# Patient Record
Sex: Female | Born: 1975 | Race: White | Hispanic: No | Marital: Single | Smoking: Never smoker
Health system: Southern US, Community
[De-identification: ages and names within clinical notes are randomized; demographics above are authoritative.]

## PROBLEM LIST (undated history)

## (undated) DIAGNOSIS — E119 Type 2 diabetes mellitus without complications: Secondary | ICD-10-CM

## (undated) DIAGNOSIS — E7800 Pure hypercholesterolemia, unspecified: Secondary | ICD-10-CM

## (undated) DIAGNOSIS — G40909 Epilepsy, unspecified, not intractable, without status epilepticus: Secondary | ICD-10-CM

## (undated) DIAGNOSIS — R569 Unspecified convulsions: Secondary | ICD-10-CM

## (undated) HISTORY — DX: Type 2 diabetes mellitus without complications: E11.9

## (undated) HISTORY — DX: Pure hypercholesterolemia, unspecified: E78.00

## (undated) HISTORY — DX: Unspecified convulsions: R56.9

## (undated) HISTORY — DX: Epilepsy, unspecified, not intractable, without status epilepticus: G40.909

---

## 2007-08-01 ENCOUNTER — Emergency Department (HOSPITAL_COMMUNITY): Admission: EM | Admit: 2007-08-01 | Discharge: 2007-08-01 | Payer: Self-pay | Admitting: Family Medicine

## 2007-12-10 ENCOUNTER — Emergency Department (HOSPITAL_COMMUNITY): Admission: EM | Admit: 2007-12-10 | Discharge: 2007-12-11 | Payer: Self-pay | Admitting: Emergency Medicine

## 2011-06-17 LAB — COMPREHENSIVE METABOLIC PANEL
ALT: 29
AST: 30
Albumin: 3.9
Calcium: 9.1
Creatinine, Ser: 0.68
GFR calc Af Amer: >60
Sodium: 138
Total Protein: 7.7

## 2011-06-17 LAB — URINALYSIS, ROUTINE W REFLEX MICROSCOPIC
Bilirubin Urine: NEGATIVE
Glucose, UA: NEGATIVE
Ketones, ur: NEGATIVE
Nitrite: NEGATIVE
Specific Gravity, Urine: 1.021
pH: 6

## 2011-06-17 LAB — URINE MICROSCOPIC-ADD ON

## 2011-06-17 LAB — LIPASE, BLOOD: Lipase: 24

## 2011-06-17 LAB — DIFFERENTIAL
Eosinophils Absolute: 0
Eosinophils Relative: 0
Lymphocytes Relative: 8 — ABNORMAL LOW
Lymphs Abs: 0.9
Monocytes Absolute: 0.2
Monocytes Relative: 2 — ABNORMAL LOW

## 2011-06-17 LAB — CBC
MCHC: 33.9
MCV: 85.3
Platelets: 308
RBC: 5.14 — ABNORMAL HIGH

## 2011-07-02 LAB — POCT RAPID STREP A: Streptococcus, Group A Screen (Direct): NEGATIVE

## 2014-09-06 DIAGNOSIS — D229 Melanocytic nevi, unspecified: Secondary | ICD-10-CM

## 2014-09-06 HISTORY — DX: Melanocytic nevi, unspecified: D22.9

## 2015-05-03 LAB — HIV ANTIBODY (ROUTINE TESTING W REFLEX): HIV: NONREACTIVE

## 2016-03-22 LAB — TSH: TSH: 1.11 (ref 0.41–5.90)

## 2016-03-22 LAB — LIPID PANEL
Cholesterol: 207 — AB (ref 0–200)
HDL: 82 — AB (ref 35–70)
LDL Cholesterol: 87
Triglycerides: 192 — AB (ref 40–160)

## 2016-03-22 LAB — BASIC METABOLIC PANEL
BUN: 12 (ref 4–21)
CREATININE: 1 (ref 0.5–1.1)
GLUCOSE: 116
POTASSIUM: 4.4 (ref 3.4–5.3)
SODIUM: 139 (ref 137–147)

## 2016-03-22 LAB — CBC AND DIFFERENTIAL
HEMATOCRIT: 45 (ref 36–46)
HEMOGLOBIN: 15.4 (ref 12.0–16.0)
Platelets: 307 (ref 150–399)
WBC: 8.2

## 2016-03-22 LAB — HEMOGLOBIN A1C: HEMOGLOBIN A1C: 6.1

## 2016-03-22 LAB — VITAMIN D 25 HYDROXY (VIT D DEFICIENCY, FRACTURES)

## 2016-03-22 LAB — VITAMIN B12: Vitamin B-12: 326

## 2016-03-23 HISTORY — PX: OTHER SURGICAL HISTORY: SHX169

## 2016-04-02 ENCOUNTER — Other Ambulatory Visit: Payer: Self-pay | Admitting: Family Medicine

## 2016-04-02 DIAGNOSIS — Z1231 Encounter for screening mammogram for malignant neoplasm of breast: Secondary | ICD-10-CM

## 2016-04-29 ENCOUNTER — Ambulatory Visit: Payer: Self-pay

## 2016-05-08 ENCOUNTER — Ambulatory Visit: Payer: Self-pay

## 2016-05-13 ENCOUNTER — Ambulatory Visit
Admission: RE | Admit: 2016-05-13 | Discharge: 2016-05-13 | Disposition: A | Discharge status: Home / Self Care | Payer: Medicare Other | Source: Ambulatory Visit | Attending: Family Medicine | Admitting: Family Medicine

## 2016-05-13 DIAGNOSIS — Z1231 Encounter for screening mammogram for malignant neoplasm of breast: Secondary | ICD-10-CM

## 2016-05-14 ENCOUNTER — Other Ambulatory Visit: Payer: Self-pay | Admitting: Family Medicine

## 2016-05-14 DIAGNOSIS — R928 Other abnormal and inconclusive findings on diagnostic imaging of breast: Secondary | ICD-10-CM

## 2016-05-20 ENCOUNTER — Ambulatory Visit
Admission: RE | Admit: 2016-05-20 | Discharge: 2016-05-20 | Disposition: A | Discharge status: Home / Self Care | Payer: Medicare Other | Source: Ambulatory Visit | Attending: Family Medicine | Admitting: Family Medicine

## 2016-05-20 DIAGNOSIS — R928 Other abnormal and inconclusive findings on diagnostic imaging of breast: Secondary | ICD-10-CM

## 2016-10-31 ENCOUNTER — Ambulatory Visit (INDEPENDENT_AMBULATORY_CARE_PROVIDER_SITE_OTHER): Payer: Medicare Other | Admitting: Internal Medicine

## 2016-10-31 ENCOUNTER — Encounter: Payer: Self-pay | Admitting: Internal Medicine

## 2016-10-31 ENCOUNTER — Telehealth: Payer: Self-pay | Admitting: Internal Medicine

## 2016-10-31 VITALS — BP 104/60 | HR 79 | Temp 99.1°F | Ht 62.5 in | Wt 176.0 lb

## 2016-10-31 DIAGNOSIS — Z87898 Personal history of other specified conditions: Secondary | ICD-10-CM | POA: Diagnosis not present

## 2016-10-31 DIAGNOSIS — Z7689 Persons encountering health services in other specified circumstances: Secondary | ICD-10-CM | POA: Diagnosis present

## 2016-10-31 LAB — POCT GLYCOSYLATED HEMOGLOBIN (HGB A1C): Hemoglobin A1C: 5.8

## 2016-10-31 NOTE — Telephone Encounter (Signed)
Called patient's caregiver Ms. Francee Piccolo. Discussed A1c. Reports her last a1c is 6.4. Recommended continued diet and lifestyle management.

## 2016-10-31 NOTE — Progress Notes (Signed)
   [facility] Phone: [phone]   Date of Visit: 10/31/2016   HPI:  Patient presents today to establish care. No specific concerns today. Patient comes with her caregiver.   Last PCP was Dr. Tamala Julian who diagnosed her with "boderline diabetes". She was given Metformin for this, but she never started this. Instead they made diet and lifestyle changes. She checks her cbgs daily and all have been < 120 for fasting. Reports last A1c was 6.4. She also had a bone scan which her caregiver reports she thinks is normal; patient was started on Magnesium oxide, MVM, Vit D3, and calcium after the bone scan. They are unsure why the bone scan was initially ordered for the patient.    PMH: updated in chart Family History: updated in chart  Social history: [facility] in [facility]. Exercises twice a week. Never smoker. No other drug use. No alcohol use. Updated in chart Surgical history: updated in chart  Health Maintenance:  Pap smear: had a pap smear over 10 years ago which was traumatic for patient. Asked if continued surveillance is necessary. Discussed this. Caregiver to discuss with patient's mother regarding this.  Mammogram: 2017- normal per caregiver DEXA 2017 normal.  Flu vaccine 05/2016   Periods: none; skips the placebo pills of sprintec  Contraception: sprintec  Pelvic symptoms: denies  Sexual activity: no  Mood: PHq 2 negative   ROS: See HPI   PHYSICAL EXAM: BP 104/60   Pulse 79   Temp 99.1 F (37.3 C) (Oral)   Ht 5' 2.5" (1.588 m)   Wt 176 lb (79.8 kg)   SpO2 98%   BMI 31.68 kg/m  Gen: NAD, pleasant, cooperative HEENT: NCAT, PERRL, no palpable thyromegaly or anterior cervical lymphadenopathy Heart: RRR, no murmurs Lungs: CTAB, NWOB Abdomen: soft, nontender to palpation Neuro: grossly nonfocal, speech normal  ASSESSMENT/PLAN:  # Health maintenance:  - signed release of information for prior PCP and neurologist.  - caregiver to call back  regarding pap smear decision with mother  Prediabetes Caregiver reports last A1c was 6.4. A1c 5.8. Continue lifestyle and diet control.    Smiley Houseman, MD PGY [place]

## 2016-10-31 NOTE — Patient Instructions (Signed)
It was nice to meet you  We will check your Hemoglobin A1c today. I will call you with the results.   We will get records from Dr. Tamala Julian and Dr. Marlou Sa. I will give you a call when I get these records.

## 2016-11-01 ENCOUNTER — Encounter: Payer: Self-pay | Admitting: Internal Medicine

## 2016-11-01 DIAGNOSIS — G40909 Epilepsy, unspecified, not intractable, without status epilepticus: Secondary | ICD-10-CM | POA: Insufficient documentation

## 2016-11-01 DIAGNOSIS — E119 Type 2 diabetes mellitus without complications: Secondary | ICD-10-CM | POA: Insufficient documentation

## 2016-11-01 DIAGNOSIS — R7303 Prediabetes: Secondary | ICD-10-CM | POA: Insufficient documentation

## 2016-11-01 DIAGNOSIS — E785 Hyperlipidemia, unspecified: Secondary | ICD-10-CM | POA: Insufficient documentation

## 2016-11-05 ENCOUNTER — Encounter: Payer: Self-pay | Admitting: Internal Medicine

## 2016-11-05 DIAGNOSIS — Z3041 Encounter for surveillance of contraceptive pills: Secondary | ICD-10-CM | POA: Insufficient documentation

## 2016-11-05 NOTE — Assessment & Plan Note (Signed)
Caregiver reports last A1c was 6.4. A1c 5.8. Continue lifestyle and diet control.

## 2016-11-14 ENCOUNTER — Other Ambulatory Visit: Payer: Self-pay | Admitting: Internal Medicine

## 2017-02-03 ENCOUNTER — Other Ambulatory Visit: Payer: Self-pay | Admitting: Internal Medicine

## 2017-02-10 ENCOUNTER — Encounter: Payer: Self-pay | Admitting: Internal Medicine

## 2017-02-10 ENCOUNTER — Ambulatory Visit (INDEPENDENT_AMBULATORY_CARE_PROVIDER_SITE_OTHER): Payer: Medicare Other | Admitting: Internal Medicine

## 2017-02-10 VITALS — BP 108/68 | HR 74 | Temp 98.3°F | Ht 62.5 in | Wt 169.0 lb

## 2017-02-10 DIAGNOSIS — R1900 Intra-abdominal and pelvic swelling, mass and lump, unspecified site: Secondary | ICD-10-CM

## 2017-02-10 DIAGNOSIS — Z8639 Personal history of other endocrine, nutritional and metabolic disease: Secondary | ICD-10-CM | POA: Insufficient documentation

## 2017-02-10 DIAGNOSIS — E785 Hyperlipidemia, unspecified: Secondary | ICD-10-CM | POA: Diagnosis present

## 2017-02-10 LAB — POCT URINE PREGNANCY: PREG TEST UR: NEGATIVE

## 2017-02-10 NOTE — Patient Instructions (Addendum)
I will call you with the results.  Follow up around august

## 2017-02-10 NOTE — Progress Notes (Signed)
   [facility] Phone: [phone]   Date of Visit: 02/10/2017   HPI:  Patient is here for follow up for HLD.   HLD: - has a history of HLD as well as family history - is on Simvastatin 40mg  daily - no RUQ pain or myalgias  Questionable Pelvic Mass: - on physical exam, noted a firm area in the suprapubic region that was nontender to palpation - patient has never noticed it and caretaker has not noticed either. Patient's mother reported to the caretaker (over phone) that patient has been complaining of abdominal pain - denies dysuria or urinary frequency or the need to urinate currently - she is on OCPs and skips placebo tablets. No abnormal vaginal bleeding - caretaker reports patient is not and has never been sexually active.  - patient has never had a pap smear as she is unable to tolerate it.   ROS: See HPI.  [place]:  PMH:  Epilepsy Prediabetes HLD   PHYSICAL EXAM: BP 108/68   Pulse 74   Temp 98.3 F (36.8 C) (Oral)   Ht 5' 2.5" (1.588 m)   Wt 169 lb (76.7 kg)   SpO2 98%   BMI 30.42 kg/m  GEN: NAD CV: RRR, no murmurs, rubs, or gallops PULM: CTAB, normal effort ABD: Soft, nontender, palpable firm area/mass in the suprapubic region which is nontender, normal bowel sounds  SKIN: No rash or cyanosis; warm and well-perfused EXTR: No lower extremity edema or calf tenderness PSYCH: Mood and affect euthymic, normal rate and volume of speech NEURO: Awake, alert, no focal deficits grossly, normal speech   ASSESSMENT/PLAN: Hyperlipidemia, unspecified hyperlipidemia type - Continue Simvastatin - Lipid panel - Hepatic Function Panel  Pelvic mass in female: unclear in etiology. No tenderness, sigs/symptoms of infection. Patient does not tolerate pap smears or vaginal exams, therefore will first obtain trans-abdominal pelvic ultrasound. If abnormal will need transvaginal US. - POCT urine pregnancy is negative.  - US Pelvis Complete;  Future  Smiley Houseman, MD PGY [place]

## 2017-02-10 NOTE — Progress Notes (Signed)
ROI signed and faxed and placed to be scanned for Dr. Marlou Sa  (neurologist @ [facility]) Cindy [name], Cindy [name], Cindy [name]

## 2017-02-11 ENCOUNTER — Encounter: Payer: Self-pay | Admitting: Internal Medicine

## 2017-02-11 LAB — HEPATIC FUNCTION PANEL
ALBUMIN: 3.9 g/dL (ref 3.5–5.5)
ALK PHOS: 48 IU/L (ref 39–117)
ALT: 9 IU/L (ref 0–32)
AST: 12 IU/L (ref 0–40)
BILIRUBIN, DIRECT: 0.1 mg/dL (ref 0.00–0.40)
Bilirubin Total: 0.3 mg/dL (ref 0.0–1.2)
TOTAL PROTEIN: 6.8 g/dL (ref 6.0–8.5)

## 2017-02-11 LAB — LIPID PANEL
Chol/HDL Ratio: 2.9 ratio (ref 0.0–4.4)
Cholesterol, Total: 166 mg/dL (ref 100–199)
HDL: 58 mg/dL (ref >39)
LDL CALC: 75 mg/dL (ref 0–99)
Triglycerides: 163 mg/dL — ABNORMAL HIGH (ref 0–149)
VLDL CHOLESTEROL CAL: 33 mg/dL (ref 5–40)

## 2017-02-11 NOTE — Assessment & Plan Note (Signed)
Lipid panel today. Continue Simvastatin. °

## 2017-02-11 NOTE — Progress Notes (Signed)
Letter sent regarding lipid panel results.

## 2017-02-20 ENCOUNTER — Ambulatory Visit (HOSPITAL_COMMUNITY)
Admission: RE | Admit: 2017-02-20 | Discharge: 2017-02-20 | Disposition: A | Discharge status: Home / Self Care | Payer: Medicare Other | Source: Ambulatory Visit | Attending: Family Medicine | Admitting: Family Medicine

## 2017-02-20 DIAGNOSIS — R1900 Intra-abdominal and pelvic swelling, mass and lump, unspecified site: Secondary | ICD-10-CM

## 2017-02-25 ENCOUNTER — Ambulatory Visit (HOSPITAL_COMMUNITY)
Admission: RE | Admit: 2017-02-25 | Discharge: 2017-02-25 | Disposition: A | Discharge status: Home / Self Care | Payer: Medicare Other | Source: Ambulatory Visit | Attending: Family Medicine | Admitting: Family Medicine

## 2017-02-25 DIAGNOSIS — N858 Other specified noninflammatory disorders of uterus: Secondary | ICD-10-CM | POA: Diagnosis not present

## 2017-02-25 DIAGNOSIS — R1900 Intra-abdominal and pelvic swelling, mass and lump, unspecified site: Secondary | ICD-10-CM | POA: Diagnosis present

## 2017-02-25 DIAGNOSIS — N852 Hypertrophy of uterus: Secondary | ICD-10-CM | POA: Insufficient documentation

## 2017-02-26 ENCOUNTER — Other Ambulatory Visit: Payer: Self-pay | Admitting: Student

## 2017-02-26 DIAGNOSIS — D259 Leiomyoma of uterus, unspecified: Secondary | ICD-10-CM

## 2017-02-26 NOTE — Progress Notes (Signed)
Called patient on her cell phone number. Some one by the name Bobbye Charleston picked up the phone and identified herself as her provider. Per Alma Downs, patient is disabled. I discussed the [place] finding with [place]. I ordered a referral to gynecologist for further evaluation of the fibroid.

## 2017-02-27 ENCOUNTER — Other Ambulatory Visit: Payer: Self-pay | Admitting: Internal Medicine

## 2017-03-04 ENCOUNTER — Encounter: Payer: Self-pay | Admitting: Obstetrics & Gynecology

## 2017-03-14 ENCOUNTER — Encounter: Payer: Self-pay | Admitting: Internal Medicine

## 2017-03-14 ENCOUNTER — Ambulatory Visit (INDEPENDENT_AMBULATORY_CARE_PROVIDER_SITE_OTHER): Payer: Medicare Other | Admitting: Internal Medicine

## 2017-03-14 DIAGNOSIS — N858 Other specified noninflammatory disorders of uterus: Secondary | ICD-10-CM | POA: Insufficient documentation

## 2017-03-14 DIAGNOSIS — F79 Unspecified intellectual disabilities: Secondary | ICD-10-CM | POA: Diagnosis not present

## 2017-03-14 DIAGNOSIS — N859 Noninflammatory disorder of uterus, unspecified: Secondary | ICD-10-CM | POA: Diagnosis not present

## 2017-03-14 NOTE — Assessment & Plan Note (Signed)
FL2 form reviewed and signed today

## 2017-03-14 NOTE — Progress Notes (Signed)
   [facility] Phone: [phone]   Date of Visit: 03/14/2017   HPI:  Patient is here with caregiver to review FL2 Form.  Need for FL2 Form: Intellectual Disability, Epilepsy   - caregiver reports patient gets disoriented easily  - she lives in a ladies group home and is doing well there  - she is able to feed, dress, and bathe herself.  - she is continent  - she is able to verbally communicate - she does have a history of epilepsy which is currently controlled on her current medications. She is followed by a neurologist   Fundal Mass:   - first noted in clinic on 5/21. Pelvic US obtained showed Uterine enlargement with large 12.3 cm hypoechoic fundal mass most likely representing a fibroid - still denies abnormal vaginal bleeding. Is on sprintec and skips placebos  - no dysuria, urinary frequency - caregiver reports of continued intermittent report of low abdominal pain - has regular soft bowel movements  - caregiver reports that patient's mother asked if we would be able to provide a medication for anxiety for this GYN visit in July (7/17). She asked if we could consult with her neurologist to figure out the best medication. Caregiver reports that Ativan has not helped in the past.   ROS: See HPI.  [place]:  Intellectual Disability Epilepsy Prediabetes   PHYSICAL EXAM: BP 102/80   Pulse 82   Temp 98.6 F (37 C) (Oral)   Ht 5' 2.5" (1.588 m)   Wt 169 lb (76.7 kg)   LMP  (LMP Unknown)   SpO2 98%   BMI 30.42 kg/m  GEN: NAD HEENT: normal thyroid  CV: RRR, no murmurs, rubs, or gallops PULM: CTAB, normal effort SKIN: No rash or cyanosis; warm and well-perfused EXTR: No lower extremity edema or calf tenderness PSYCH: Mood and affect euthymic, normal rate and volume of speech NEURO: Awake, alert, no focal deficits grossly, normal speech   ASSESSMENT/PLAN:  Intellectual disability FL2 form reviewed and signed today   Uterine mass Will call  patient's neurologist to figure out which acute anti-anxiety med to use on the day of GYN visit.    Smiley Houseman, MD PGY [place]

## 2017-03-14 NOTE — Assessment & Plan Note (Signed)
Will call patient's neurologist to figure out which acute anti-anxiety med to use on the day of GYN visit.

## 2017-03-24 ENCOUNTER — Ambulatory Visit: Payer: Medicare Other | Admitting: Internal Medicine

## 2017-03-28 ENCOUNTER — Encounter: Payer: Self-pay | Admitting: Internal Medicine

## 2017-03-28 ENCOUNTER — Telehealth: Payer: Self-pay | Admitting: Internal Medicine

## 2017-03-28 DIAGNOSIS — M8580 Other specified disorders of bone density and structure, unspecified site: Secondary | ICD-10-CM | POA: Insufficient documentation

## 2017-03-28 DIAGNOSIS — Z87898 Personal history of other specified conditions: Secondary | ICD-10-CM | POA: Insufficient documentation

## 2017-03-28 DIAGNOSIS — E669 Obesity, unspecified: Secondary | ICD-10-CM | POA: Insufficient documentation

## 2017-03-28 LAB — HIV ANTIBODY (ROUTINE TESTING W REFLEX)
HIV: NONREACTIVE
RPR: NONREACTIVE

## 2017-03-28 NOTE — Progress Notes (Signed)
Entered old labs into chart (from prior PCP)

## 2017-03-28 NOTE — Progress Notes (Signed)
Entered old lab results from prior PCP

## 2017-03-28 NOTE — Telephone Encounter (Signed)
Called patient's caregiver, Ms. Francee Piccolo to figure out who Ms. Kunst's neurologist is   Dr. Adolphus Birchwood. [facility]  Dr. Randel Pigg office gave Dr's cell phone number as the best way to get in touch with her: [phone].   I called this number and it went to voicemail. Left message to call back.

## 2017-04-04 ENCOUNTER — Telehealth: Payer: Self-pay | Admitting: Internal Medicine

## 2017-04-04 NOTE — Telephone Encounter (Signed)
Called patient's mother to discuss medication needed for patient to be relaxed during GYN exam.   I received a phone call from Dr. Randel Pigg nurse who reports that Dr. Marlou Sa recommends IV Valim 1mg , can go up to 2mg  to help with the exam.   I called patient's mother to report that I can't prescribe an IV mediation in the outpatient setting. I asked that she call the GYN to determine if they would even be able to administer IV med in the clinic. An alternative would be doing PO valium. Patient's mother to call back the clinic.

## 2017-04-07 MED ORDER — DIAZEPAM 2 MG PO TABS: ORAL_TABLET | ORAL | 0 refills | Status: DC

## 2017-04-07 NOTE — Addendum Note (Signed)
Addended by: Smiley Houseman on: 04/07/2017 11:20 AM   Modules accepted: Orders

## 2017-04-07 NOTE — Telephone Encounter (Signed)
Pt's appointment at [facility] is tomorrow, pt will need Valium in the pill. Please call when Rx is ready. ep

## 2017-04-07 NOTE — Telephone Encounter (Signed)
Caregiver is aware that script is ready to be picked up. Jazmin Hartsell,CMA

## 2017-04-07 NOTE — Telephone Encounter (Signed)
Rx printed and placed at front desk. Please inform patient's mother.

## 2017-04-08 ENCOUNTER — Encounter: Payer: Self-pay | Admitting: Obstetrics & Gynecology

## 2017-04-08 ENCOUNTER — Ambulatory Visit (INDEPENDENT_AMBULATORY_CARE_PROVIDER_SITE_OTHER): Payer: Medicare Other | Admitting: Medical

## 2017-04-08 ENCOUNTER — Encounter (HOSPITAL_COMMUNITY): Payer: Self-pay

## 2017-04-08 VITALS — BP 118/61 | HR 80 | Ht 62.0 in | Wt 169.6 lb

## 2017-04-08 DIAGNOSIS — Z124 Encounter for screening for malignant neoplasm of cervix: Secondary | ICD-10-CM

## 2017-04-08 DIAGNOSIS — Z01419 Encounter for gynecological examination (general) (routine) without abnormal findings: Secondary | ICD-10-CM

## 2017-04-08 NOTE — Progress Notes (Signed)
Subjective:    Cindy [name] is a 41 y.o. female who presents for an annual exam. The patient has no complaints today. The patient is not sexually active. GYN screening history: last pap: was normal patient and caretaker unsure when it was performed. The patient wears seatbelts: yes. The patient participates in regular exercise: yes. Has the patient ever been transfused or tattooed?: not asked. The patient reports that there is not domestic violence in her life.   Recent US showed large fibroid. Patient has been on continuous OCPs x 4 years and does not have a period. She was originally placed on OCPs for regulation of hormones as a way to manage her epilepsy. She denies any recent vaginal bleeding or abdominal pain. She has never been sexually active.   Menstrual History: OB History    Gravida Para Term Preterm AB Living   0 0 0 0 0 0   SAB TAB Ectopic Multiple Live Births   0 0 0 0 0      No LMP recorded. Patient is not currently having periods (Reason: Oral contraceptives).    The following portions of the patient's history were reviewed and updated as appropriate: allergies, current medications, past family history, past medical history, past social history, past surgical history and problem list.  Review of Systems Pertinent items are noted in HPI.    Objective:   Physical Exam  Nursing note and vitals reviewed. Constitutional: She is oriented to person, place, and time. She appears well-developed and well-nourished. No distress.  HENT:  Head: Normocephalic and atraumatic.  Cardiovascular: Normal rate.   Respiratory: Effort normal.  GI: Soft. She exhibits mass (firm pelvic mass palpable, non-tender). She exhibits no distension. There is no tenderness. There is no rebound and no guarding.  Genitourinary: Uterus is enlarged. No bleeding in the vagina. Vaginal discharge (small, white) found.  Genitourinary Comments: Patient unable to tolerate exam. Pap smear attempted   Neurological: She is alert and oriented to person, place, and time.  Skin: Skin is warm and dry. No erythema.  Psychiatric: She has a normal mood and affect.   MDM Patient will require exam under sedation. Message sent to schedule with Dr. Hulan Fray. Patient and caretakers advised that next available is late September.  .    Assessment:    Healthy female exam.   Large uterine fibroid Oral contraceptive use    Plan:     Await pap smear results. Unlikely to be satisfactory.  Patient will be contacted with date for sedated exam Advised to call office if patient develops severe abdominal pain or heavy vaginal bleeding   Luvenia Redden, PA-C 04/08/2017 9:18 AM

## 2017-04-08 NOTE — Patient Instructions (Signed)
Uterine Fibroids Uterine fibroids are tissue masses (tumors). They are also called leiomyomas. They can develop inside of a woman's womb (uterus). They can grow very large. Fibroids are not cancerous (benign). Most fibroids do not require medical treatment. Follow these instructions at home:  Keep all follow-up visits as told by your doctor. This is important.  Take medicines only as told by your doctor. ? If you were prescribed a hormone treatment, take the hormone medicines exactly as told. ? Do not take aspirin. It can cause bleeding.  Ask your doctor about taking iron pills and increasing the amount of dark green, leafy vegetables in your diet. These actions can help to boost your blood iron levels.  Pay close attention to your period. Tell your doctor about any changes, such as: ? Increased blood flow. This may require you to use more pads or tampons than usual per month. ? A change in the number of days that your period lasts per month. ? A change in symptoms that come with your period, such as back pain or cramping in your belly area (abdomen). Contact a doctor if:  You have pain in your back or the area between your hip bones (pelvic area) that is not controlled by medicines.  You have pain in your abdomen that is not controlled with medicines.  You have an increase in bleeding between and during periods.  You soak tampons or pads in a half hour or less.  You feel lightheaded.  You feel extra tired.  You feel weak. Get help right away if:  You pass out (faint).  You have a sudden increase in pelvic pain. This information is not intended to replace advice given to you by your health care provider. Make sure you discuss any questions you have with your health care provider. Document Released: 10/12/2010 Document Revised: 05/10/2016 Document Reviewed: 03/08/2014 Elsevier Interactive Patient Education  2018 Elsevier Inc.  

## 2017-04-09 ENCOUNTER — Other Ambulatory Visit (HOSPITAL_COMMUNITY)
Admission: RE | Admit: 2017-04-09 | Discharge: 2017-04-09 | Disposition: A | Discharge status: Home / Self Care | Payer: Medicare Other | Source: Ambulatory Visit | Attending: Medical | Admitting: Medical

## 2017-04-09 DIAGNOSIS — Z01419 Encounter for gynecological examination (general) (routine) without abnormal findings: Secondary | ICD-10-CM | POA: Insufficient documentation

## 2017-04-09 NOTE — Addendum Note (Signed)
Addended by: Neta Ehlers on: 04/09/2017 05:05 PM   Modules accepted: Orders

## 2017-04-14 LAB — CYTOLOGY - PAP
DIAGNOSIS: NEGATIVE
HPV: NOT DETECTED

## 2017-04-15 ENCOUNTER — Other Ambulatory Visit: Payer: Self-pay | Admitting: Family Medicine

## 2017-04-15 DIAGNOSIS — Z1231 Encounter for screening mammogram for malignant neoplasm of breast: Secondary | ICD-10-CM

## 2017-04-18 ENCOUNTER — Other Ambulatory Visit: Payer: Self-pay | Admitting: Internal Medicine

## 2017-05-23 ENCOUNTER — Ambulatory Visit: Payer: Medicare Other | Admitting: Obstetrics & Gynecology

## 2017-05-30 ENCOUNTER — Ambulatory Visit
Admission: RE | Admit: 2017-05-30 | Discharge: 2017-05-30 | Disposition: A | Discharge status: Home / Self Care | Payer: Medicare Other | Source: Ambulatory Visit | Attending: Family Medicine | Admitting: Family Medicine

## 2017-05-30 DIAGNOSIS — Z1231 Encounter for screening mammogram for malignant neoplasm of breast: Secondary | ICD-10-CM

## 2017-06-11 ENCOUNTER — Encounter: Payer: Self-pay | Admitting: Obstetrics & Gynecology

## 2017-06-11 ENCOUNTER — Ambulatory Visit (INDEPENDENT_AMBULATORY_CARE_PROVIDER_SITE_OTHER): Payer: Medicare Other | Admitting: Obstetrics & Gynecology

## 2017-06-11 VITALS — BP 121/72 | HR 78 | Ht 62.0 in | Wt 165.0 lb

## 2017-06-11 DIAGNOSIS — D251 Intramural leiomyoma of uterus: Secondary | ICD-10-CM

## 2017-06-11 DIAGNOSIS — Z23 Encounter for immunization: Secondary | ICD-10-CM

## 2017-06-11 DIAGNOSIS — Z0000 Encounter for general adult medical examination without abnormal findings: Secondary | ICD-10-CM

## 2017-06-11 NOTE — Progress Notes (Signed)
   Subjective:    Patient ID: Cindy [name], female    DOB: 11-18-1975, 41 y.o.   MRN: [id number]  HPI  41 yo SW G0 mentally challenged lady here with her care provider to discuss her fibroid. She has been on continuous OCPs for about 15 years, rarely has BTB, very happy with OCPs. She has no complaints.  Review of Systems Her mammogram is UTD.    Objective:   Physical Exam  Well nourished, well hydrated white female, no apparent distress Breathing, conversing, and ambulating normally     Assessment & Plan:  Fibroid- asymptomatic Reassurance given Flu vaccine today

## 2017-06-12 ENCOUNTER — Ambulatory Visit: Admit: 2017-06-12 | Payer: Medicare Other | Admitting: Obstetrics & Gynecology

## 2017-06-12 SURGERY — EXAM UNDER ANESTHESIA
Anesthesia: Choice

## 2017-06-17 ENCOUNTER — Other Ambulatory Visit: Payer: Self-pay | Admitting: Internal Medicine

## 2017-07-29 ENCOUNTER — Other Ambulatory Visit: Payer: Self-pay | Admitting: Internal Medicine

## 2017-08-25 ENCOUNTER — Other Ambulatory Visit: Payer: Self-pay | Admitting: Internal Medicine

## 2017-08-26 ENCOUNTER — Other Ambulatory Visit: Payer: Self-pay | Admitting: *Deleted

## 2017-08-26 MED ORDER — MULTIVITAMIN ADULT PO TABS: 1.0000 | ORAL_TABLET | Freq: Every day | ORAL | 0 refills | Status: DC

## 2017-08-26 NOTE — Telephone Encounter (Signed)
This is needed as an order to allow the group home to administer. Granville Whitefield, Salome Spotted, CMA

## 2017-09-29 ENCOUNTER — Other Ambulatory Visit: Payer: Self-pay | Admitting: Internal Medicine

## 2017-10-27 ENCOUNTER — Other Ambulatory Visit: Payer: Self-pay | Admitting: Internal Medicine

## 2017-11-14 NOTE — Progress Notes (Deleted)
   [facility] Phone: [phone]   Date of Visit: 11/17/2017   HPI:  ***  ROS: See HPI.  [place]: ***  PHYSICAL EXAM: There were no vitals taken for this visit. Gen: *** HEENT: *** Heart: *** Lungs: *** Neuro: *** Ext: ***  ASSESSMENT/PLAN:  Health maintenance:  -***  No problem-specific Assessment & Plan notes found for this encounter.  FOLLOW UP: Follow up in *** for ***  Smiley Houseman, MD PGY [place]

## 2017-11-17 ENCOUNTER — Ambulatory Visit: Payer: Medicare Other | Admitting: Internal Medicine

## 2018-01-26 ENCOUNTER — Other Ambulatory Visit: Payer: Self-pay | Admitting: Internal Medicine

## 2018-01-27 MED ORDER — SIMVASTATIN 40 MG PO TABS: 40.0000 mg | ORAL_TABLET | Freq: Every day | ORAL | 11 refills | Status: DC

## 2018-02-13 ENCOUNTER — Other Ambulatory Visit: Payer: Self-pay | Admitting: Student

## 2018-02-18 ENCOUNTER — Other Ambulatory Visit: Payer: Self-pay | Admitting: Internal Medicine

## 2018-02-18 MED ORDER — NORGESTIMATE-ETH ESTRADIOL 0.25-35 MG-MCG PO TABS: ORAL_TABLET | ORAL | 11 refills | Status: DC

## 2018-02-18 NOTE — Addendum Note (Signed)
Addended by: Smiley Houseman on: 02/18/2018 12:21 PM   Modules accepted: Orders

## 2018-03-08 NOTE — Progress Notes (Deleted)
   [facility] Phone: [phone]   Date of Visit: 03/09/2018   HPI:  ***  ROS: See HPI.  [place]: ***  PHYSICAL EXAM: There were no vitals taken for this visit. Gen: *** HEENT: *** Heart: *** Lungs: *** Neuro: *** Ext: ***  ASSESSMENT/PLAN:  Health maintenance:  -***  No problem-specific Assessment & Plan notes found for this encounter.  FOLLOW UP: Follow up in *** for ***  Smiley Houseman, MD PGY [place]

## 2018-03-09 ENCOUNTER — Ambulatory Visit: Payer: Medicare Other | Admitting: Internal Medicine

## 2018-03-16 ENCOUNTER — Ambulatory Visit: Payer: Medicare Other | Admitting: Internal Medicine

## 2018-03-16 NOTE — Progress Notes (Signed)
   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 - she denies any chest pain, shortness of breath, back/joint pains - she participates in tennis and swimming  - she does have a history of seizures but has been seizure free for the past 4 years and 5 months   ROS: See HPI.  [place]:  PMH: Epilepsy  Prediabetes HLD Intellectual Disability  Obesity   PHYSICAL EXAM: BP 96/62   Pulse 77   Temp 98.8 F (37.1 C) (Oral)   Wt 178 lb (80.7 kg)   SpO2 97%   BMI 32.56 kg/m  GEN: NAD HEENT: Atraumatic, normocephalic, neck supple, EOMI, sclera clear, ear canals appear normal but unable to see tympanic membranes due to cerumen impaction.   CV: RRR, no murmurs, rubs, or gallops PULM: CTAB, normal effort ABD: Soft, nontender, nondistended, NABS, no organomegaly MSK:  Back: normal range of motion.  Shoulders: normal range of motion Hips: normal range of motion  Knees: normal with normal range of motion  Strength: normal in upper and lower extremities bilaterally. Normal sensation to light touch in all extremities. Normal patellar, achilles, and bicep reflexes bilaterally. SKIN: No rash or cyanosis; warm and well-perfused EXTR: No lower extremity edema or calf tenderness PSYCH: Mood and affect euthymic, normal rate and volume of speech NEURO: Awake, alert, no focal deficits grossly, normal speech   ASSESSMENT/PLAN:  1. Encounter for completion of form with patient FL2 and form for special Olympics completed and provided to patient.    2. Prediabetes A1c 6.1 from 5.8. Continued recommendation of diet and exercise. Repeat A1c in 6 months  - HgB A1c  3. Hyperlipidemia, unspecified hyperlipidemia type Patient to make lab visit and come in fasting for lipid check.  - Lipid panel; Future  Smiley Houseman, MD PGY [place]

## 2018-03-17 ENCOUNTER — Ambulatory Visit (INDEPENDENT_AMBULATORY_CARE_PROVIDER_SITE_OTHER): Payer: Medicare Other | Admitting: Internal Medicine

## 2018-03-17 ENCOUNTER — Encounter: Payer: Self-pay | Admitting: Internal Medicine

## 2018-03-17 ENCOUNTER — Other Ambulatory Visit: Payer: Self-pay

## 2018-03-17 VITALS — BP 96/62 | HR 77 | Temp 98.8°F | Wt 178.0 lb

## 2018-03-17 DIAGNOSIS — R7303 Prediabetes: Secondary | ICD-10-CM | POA: Diagnosis not present

## 2018-03-17 DIAGNOSIS — Z0289 Encounter for other administrative examinations: Secondary | ICD-10-CM

## 2018-03-17 DIAGNOSIS — E785 Hyperlipidemia, unspecified: Secondary | ICD-10-CM | POA: Diagnosis not present

## 2018-03-17 LAB — POCT GLYCOSYLATED HEMOGLOBIN (HGB A1C): HBA1C, POC (PREDIABETIC RANGE): 6.1 % (ref 5.7–6.4)

## 2018-03-17 MED ORDER — TETANUS-DIPHTH-ACELL PERTUSSIS 5-2.5-18.5 LF-MCG/0.5 IM SUSP: 0.5000 mL | Freq: Once | INTRAMUSCULAR | 0 refills | Status: AC

## 2018-03-17 NOTE — Patient Instructions (Addendum)
1) I sent your tetanus shot to the pharmacy   2) please make a lab visit for your cholesterol check. Come in fasting   3) we will check your a1c today

## 2018-03-27 ENCOUNTER — Other Ambulatory Visit: Payer: Self-pay | Admitting: Internal Medicine

## 2018-06-05 ENCOUNTER — Other Ambulatory Visit: Payer: Self-pay | Admitting: Family Medicine

## 2018-06-05 DIAGNOSIS — Z1231 Encounter for screening mammogram for malignant neoplasm of breast: Secondary | ICD-10-CM

## 2018-06-08 ENCOUNTER — Other Ambulatory Visit: Payer: Self-pay | Admitting: Internal Medicine

## 2018-06-19 ENCOUNTER — Ambulatory Visit (INDEPENDENT_AMBULATORY_CARE_PROVIDER_SITE_OTHER): Payer: Medicare Other

## 2018-06-19 DIAGNOSIS — Z23 Encounter for immunization: Secondary | ICD-10-CM | POA: Diagnosis present

## 2018-07-03 ENCOUNTER — Ambulatory Visit
Admission: RE | Admit: 2018-07-03 | Discharge: 2018-07-03 | Disposition: A | Discharge status: Home / Self Care | Payer: Medicare Other | Source: Ambulatory Visit | Attending: Family Medicine | Admitting: Family Medicine

## 2018-07-03 DIAGNOSIS — Z1231 Encounter for screening mammogram for malignant neoplasm of breast: Secondary | ICD-10-CM

## 2018-07-17 ENCOUNTER — Ambulatory Visit: Payer: Medicare Other | Admitting: Family Medicine

## 2018-07-31 ENCOUNTER — Other Ambulatory Visit: Payer: Self-pay

## 2018-07-31 ENCOUNTER — Ambulatory Visit (INDEPENDENT_AMBULATORY_CARE_PROVIDER_SITE_OTHER): Payer: Medicare Other | Admitting: Family Medicine

## 2018-07-31 ENCOUNTER — Encounter: Payer: Self-pay | Admitting: Family Medicine

## 2018-07-31 VITALS — BP 120/78 | HR 73 | Temp 99.5°F | Wt 174.0 lb

## 2018-07-31 DIAGNOSIS — R7303 Prediabetes: Secondary | ICD-10-CM

## 2018-07-31 DIAGNOSIS — G40909 Epilepsy, unspecified, not intractable, without status epilepticus: Secondary | ICD-10-CM

## 2018-07-31 DIAGNOSIS — R7309 Other abnormal glucose: Secondary | ICD-10-CM

## 2018-07-31 DIAGNOSIS — E785 Hyperlipidemia, unspecified: Secondary | ICD-10-CM | POA: Diagnosis not present

## 2018-07-31 LAB — POCT GLYCOSYLATED HEMOGLOBIN (HGB A1C): Hemoglobin A1C: 6.1 % — AB (ref 4.0–5.6)

## 2018-07-31 NOTE — Progress Notes (Signed)
  Subjective:    Patient ID: Cindy [name], female    DOB: Feb 03, 1976, 42 y.o.   MRN: [id number]   CC: pre-diabetes  HPI:  Pre-Diabetes:  Last A1c 6.1 on 03/17/18 Taking medications: does not want to start metformin and would like to continue with diet and exercise Exercising with swimming, tennis and walking ROS: denies dizziness, diaphoresis, LOC, polyuria, polydipsia  Smoking status reviewed  ROS: 10 point ROS is otherwise negative, except as mentioned in HPI  Patient Active Problem List   Diagnosis Date Noted  . Obesity 03/28/2017  . History of abnormal mammogram 03/28/2017  . Osteopenia 03/28/2017  . Intellectual disability 03/14/2017  . Uterine mass 03/14/2017  . History of vitamin D deficiency 02/10/2017  . Uses oral contraception 11/05/2016  . Prediabetes 11/01/2016  . Epilepsy ([place]) 11/01/2016  . HLD (hyperlipidemia) 11/01/2016     Objective:  BP 120/78   Pulse 73   Temp 99.5 F (37.5 C) (Oral)   Wt 174 lb (78.9 kg)   SpO2 97%   BMI 31.83 kg/m  Vitals and nursing note reviewed  General: NAD, pleasant Cardiac: RRR, normal heart sounds, no murmurs Respiratory: CTAB, normal effort Abdomen: soft, nontender, nondistended Extremities: no edema or cyanosis. WWP. Skin: warm and dry, no rashes noted Neuro: alert and oriented, no focal deficits Psych: normal affect  Assessment & Plan:    Prediabetes Patient to continue diet and exercise management. Discussed metformin and they wish to avoid that at this time. A1c 6.1 today.  - CMP, CBC and lipid panel per request.     [place] Sylver Vantassell, DO Family Medicine Resident PGY-2

## 2018-07-31 NOTE — Patient Instructions (Signed)
Thank you for coming to see me today. It was a pleasure! Today we talked about:   Continue doing what you are doing with your exercise and diet.   Please follow-up with me in 6 months or sooner as needed.  If you have any questions or concerns, please do not hesitate to call the office at [phone].  Take Care,   [place] Horace Lukas, DO

## 2018-08-01 LAB — CBC
Hematocrit: 43.5 % (ref 34.0–46.6)
Hemoglobin: 14.8 g/dL (ref 11.1–15.9)
MCH: 30.2 pg (ref 26.6–33.0)
MCHC: 34 g/dL (ref 31.5–35.7)
MCV: 89 fL (ref 79–97)
PLATELETS: 281 10*3/uL (ref 150–450)
RBC: 4.9 x10E6/uL (ref 3.77–5.28)
RDW: 12.4 % (ref 12.3–15.4)
WBC: 6.6 10*3/uL (ref 3.4–10.8)

## 2018-08-01 LAB — COMPREHENSIVE METABOLIC PANEL
ALT: 11 IU/L (ref 0–32)
AST: 14 IU/L (ref 0–40)
Albumin/Globulin Ratio: 1.5 (ref 1.2–2.2)
Albumin: 4 g/dL (ref 3.5–5.5)
Alkaline Phosphatase: 48 IU/L (ref 39–117)
BUN/Creatinine Ratio: 15 (ref 9–23)
BUN: 13 mg/dL (ref 6–24)
Bilirubin Total: 0.3 mg/dL (ref 0.0–1.2)
CALCIUM: 10.2 mg/dL (ref 8.7–10.2)
CO2: 26 mmol/L (ref 20–29)
CREATININE: 0.88 mg/dL (ref 0.57–1.00)
Chloride: 98 mmol/L (ref 96–106)
GFR, EST AFRICAN AMERICAN: 94 mL/min/{1.73_m2} (ref >59)
GFR, EST NON AFRICAN AMERICAN: 81 mL/min/{1.73_m2} (ref >59)
GLUCOSE: 133 mg/dL — AB (ref 65–99)
Globulin, Total: 2.7 g/dL (ref 1.5–4.5)
Potassium: 4.7 mmol/L (ref 3.5–5.2)
Sodium: 137 mmol/L (ref 134–144)
TOTAL PROTEIN: 6.7 g/dL (ref 6.0–8.5)

## 2018-08-01 LAB — LIPID PANEL
CHOL/HDL RATIO: 3 ratio (ref 0.0–4.4)
Cholesterol, Total: 188 mg/dL (ref 100–199)
HDL: 63 mg/dL (ref >39)
LDL Calculated: 79 mg/dL (ref 0–99)
TRIGLYCERIDES: 230 mg/dL — AB (ref 0–149)
VLDL CHOLESTEROL CAL: 46 mg/dL — AB (ref 5–40)

## 2018-08-01 NOTE — Assessment & Plan Note (Signed)
Patient to continue diet and exercise management. Discussed metformin and they wish to avoid that at this time. A1c 6.1 today.  - CMP, CBC and lipid panel per request.

## 2018-10-27 ENCOUNTER — Other Ambulatory Visit: Payer: Self-pay | Admitting: Internal Medicine

## 2018-10-27 ENCOUNTER — Other Ambulatory Visit: Payer: Self-pay | Admitting: Family Medicine

## 2018-10-27 MED ORDER — NORGESTIMATE-ETH ESTRADIOL 0.25-35 MG-MCG PO TABS: ORAL_TABLET | ORAL | 11 refills | Status: DC

## 2018-10-28 NOTE — Addendum Note (Signed)
Addended by: Valerie Roys on: 10/28/2018 08:08 AM   Modules accepted: Orders

## 2018-12-21 ENCOUNTER — Telehealth: Payer: Self-pay | Admitting: *Deleted

## 2018-12-21 ENCOUNTER — Other Ambulatory Visit: Payer: Self-pay | Admitting: Family Medicine

## 2018-12-21 NOTE — Telephone Encounter (Signed)
rx request for cerovite advanced form ta, 30 tabs. Please advise. Deseree Kennon Holter, CMA

## 2019-01-19 ENCOUNTER — Other Ambulatory Visit: Payer: Self-pay | Admitting: Family Medicine

## 2019-02-22 ENCOUNTER — Other Ambulatory Visit: Payer: Self-pay | Admitting: Family Medicine

## 2019-03-18 ENCOUNTER — Other Ambulatory Visit: Payer: Self-pay | Admitting: Family Medicine

## 2019-03-23 ENCOUNTER — Ambulatory Visit: Payer: Medicare Other

## 2019-03-24 ENCOUNTER — Ambulatory Visit: Payer: Medicare Other | Admitting: Family Medicine

## 2019-03-25 ENCOUNTER — Other Ambulatory Visit: Payer: Self-pay

## 2019-03-25 ENCOUNTER — Ambulatory Visit (INDEPENDENT_AMBULATORY_CARE_PROVIDER_SITE_OTHER): Payer: Medicare Other | Admitting: Family Medicine

## 2019-03-25 VITALS — BP 110/77 | HR 83 | Wt 170.0 lb

## 2019-03-25 DIAGNOSIS — E785 Hyperlipidemia, unspecified: Secondary | ICD-10-CM | POA: Diagnosis not present

## 2019-03-25 DIAGNOSIS — N921 Excessive and frequent menstruation with irregular cycle: Secondary | ICD-10-CM

## 2019-03-25 DIAGNOSIS — G40909 Epilepsy, unspecified, not intractable, without status epilepticus: Secondary | ICD-10-CM | POA: Diagnosis not present

## 2019-03-25 DIAGNOSIS — Z23 Encounter for immunization: Secondary | ICD-10-CM | POA: Diagnosis not present

## 2019-03-25 DIAGNOSIS — R7303 Prediabetes: Secondary | ICD-10-CM

## 2019-03-25 DIAGNOSIS — Z3041 Encounter for surveillance of contraceptive pills: Secondary | ICD-10-CM | POA: Diagnosis not present

## 2019-03-25 DIAGNOSIS — R7309 Other abnormal glucose: Secondary | ICD-10-CM | POA: Diagnosis present

## 2019-03-25 DIAGNOSIS — Z87898 Personal history of other specified conditions: Secondary | ICD-10-CM

## 2019-03-25 LAB — POCT GLYCOSYLATED HEMOGLOBIN (HGB A1C): HbA1c, POC (prediabetic range): 5.9 % (ref 5.7–6.4)

## 2019-03-25 MED ORDER — TETANUS-DIPHTH-ACELL PERTUSSIS 5-2.5-18.5 LF-MCG/0.5 IM SUSP: 0.5000 mL | Freq: Once | INTRAMUSCULAR | 0 refills | Status: AC

## 2019-03-25 NOTE — Progress Notes (Signed)
Established Patient - Clinic Visit Subjective  Subjective  Patient ID: MRN [id number]  Date of birth: 05-07-1976   PCP: [place], [place], DO Name: Cindy [name], 43 y.o. female "Chrissy"   CC: Form Completion  # FL-2 Form Complete Provided by caretaker.  Patient lives at group home  #Unwanted periods Patient reports that she has been experiencing breakthrough bleeding for the last 3 months.  She believes they have been around the same time every month and usually last about 2 to 3 days.  She is not soaking through pads or having any excessive bleeding.  For the last 15 years, she has been taking OCPs and skipping placebo week to avoid her periods.  She is not currently or ever been sexually active.  She does report minor cramping associated with breakthrough bleeding.  She would like to change birth control to something that will make her amenorrheic.  #Epilepsy Patient reports that her last seizure was 5 years and 5 months ago.  Taking daily Keppra as prescribed.  #Prediabetes  Patient with previous A1c of 6.1.  She has improved dietary intake of sugar and has been walking every day.  She is compliant with her simvastatin.  She denies any polydipsia, polyuria, blurred vision, CP.  ROS: See HPI  HISTORY Meds  Allergies: Reviewed as appropriate  Pertinent PMHx: Prediabetes, epilepsy, hyperlipidemia, ID, uterine mass, obesity, history of abnormal mammogram  Social Hx: Namine reports that she has never smoked. She has never used smokeless tobacco. She reports that she does not drink alcohol or use drugs. Social History   Social History Narrative   [facility] age 4-41.    Never smoker, no alcohol or other drugs   Never been sexually active      Objective   Objective  Physical Exam:  BP 110/77   Pulse 83   Wt 170 lb (77.1 kg)   SpO2 98%   BMI 31.09 kg/m  BP 110/77   Pulse 83   Wt 170 lb (77.1 kg)   SpO2 98%   BMI 31.09 kg/m   Gen:  NAD, alert, non-toxic, well-nourished, well-appearing, pleasant HEENT: Normocephaic, atraumatic. PERRLA, clear conjuctiva, no scleral icterus and injection. Normal EOM.  Hearing intact. TM pearly grey bilaterally with no fluid.  Neck supple with no LAD, nodules, or gross abnormality.  Nares patent with no discharge.  Maxillary and frontal sinuses nontender to palpation.  CV: Regular rate and rhythm.  Normal S1-S2.  No murmur, gallops, S3, S4 appreciated.  Normal capillary refill bilaterally.  Radial pulses 2+ bilaterally. No bilateral lower extremity edema. Resp: Clear to auscultation bilaterally.  No wheezing, rales, rhonchi, or other abnormal lung sounds.  No increased work of breathing appreciated. Abd: Nontender and nondistended on palpation to all 4 quadrants.  Positive bowel sounds. Skin: No obvious rashes, lesions, or trauma.  Normal turgor.  MSK: Normal ROM. Normal strength and tone.  Neuro: Cranial nerves II through VI grossly intact. Gait normal. No obvious abnormal movements. Psych: Cooperative with exam. Pleasant. Makes good eye contact.  Genitourinary: deferred.   Pertinent Labs & Imaging:  A1C 5.9 down from 6.1   2019:  CBC, CMP wnl Lipid panel: high triglycerides and VLDL    Assessment  Assessment & Plan  Epilepsy ([place]) No seizure activity for 5 years and 5 months.  History of abnormal mammogram Annual screening mammogram in October.  HLD (hyperlipidemia) Continue simvastatin  Breakthrough bleeding on OCPs After long discussion, patient will stay on OCPs, as  she cannot tolerate pelvic exam and IUD placement would not be possible.  For a pelvic exam, her caretaker reports "she will need sedation".  She would also likely not tolerate Nexplanon.  Breakthrough bleeding possibly due to buildup of endometrial lining due to skipping placebo for 15 years.  Patient is agreeable to take placebo once yearly to allow for menstrual cycle.  Patient also has history of uterine mass  which could be causing breakthrough bleeding.  Patient is not having excessive bleeding and does not but patient at risk of anemia, it is simply a nuisance to the patient.  As patient would not tolerate transvaginal and obesity would limit abdominal ultrasound findings, will continue to monitor.  Patient to allow breakthrough bleeding this month and return with her mother should she desire any birth control changes.   Need for Tdap vaccination Sent to pharmacy.   Prediabetes Improved A1c from 6.1 to 5.9. Continue diet and exercise.      Zettie Cooley, M.D. [place]  PGY -1 03/28/2019, 11:52 AM

## 2019-03-25 NOTE — Patient Instructions (Addendum)
Dear Cindy [name],   It was very nice to see you! Thank you for taking your time to come in to be seen. Today, we discussed the following:   Annual Physical    Please schedule your mammogram for October 2020.   Please come back to follow up on discussing birth control when Hellen's mother is back in town.   Please go to the pharmacy for the Tdap vaccine.   Congratulations on your A1C!!! Keep up the great work!    Be well,   Dr. Zettie Cooley [facility] [phone]   Sign up for MyChart for instant access to your health profile, labs, orders, upcoming appointments or to contact your provider with questions.

## 2019-03-28 ENCOUNTER — Encounter: Payer: Self-pay | Admitting: Family Medicine

## 2019-03-28 DIAGNOSIS — N921 Excessive and frequent menstruation with irregular cycle: Secondary | ICD-10-CM | POA: Insufficient documentation

## 2019-03-28 DIAGNOSIS — Z23 Encounter for immunization: Secondary | ICD-10-CM | POA: Insufficient documentation

## 2019-03-28 NOTE — Assessment & Plan Note (Signed)
Continue simvastatin. 

## 2019-03-28 NOTE — Assessment & Plan Note (Addendum)
Annual screening mammogram in October.

## 2019-03-28 NOTE — Assessment & Plan Note (Signed)
Sent to pharmacy 

## 2019-03-28 NOTE — Assessment & Plan Note (Addendum)
Improved A1c from 6.1 to 5.9. Continue diet and exercise.

## 2019-03-28 NOTE — Assessment & Plan Note (Signed)
No seizure activity for 5 years and 5 months.

## 2019-03-28 NOTE — Assessment & Plan Note (Signed)
After long discussion, patient will stay on OCPs, as she cannot tolerate pelvic exam and IUD placement would not be possible.  For a pelvic exam, her caretaker reports "she will need sedation".  She would also likely not tolerate Nexplanon.  Breakthrough bleeding possibly due to buildup of endometrial lining due to skipping placebo for 15 years.  Patient is agreeable to take placebo once yearly to allow for menstrual cycle.  Patient also has history of uterine mass which could be causing breakthrough bleeding.  Patient is not having excessive bleeding and does not but patient at risk of anemia, it is simply a nuisance to the patient.  As patient would not tolerate transvaginal and obesity would limit abdominal ultrasound findings, will continue to monitor.  Patient to allow breakthrough bleeding this month and return with her mother should she desire any birth control changes.

## 2019-04-07 ENCOUNTER — Other Ambulatory Visit: Payer: Self-pay | Admitting: Family Medicine

## 2019-05-07 ENCOUNTER — Other Ambulatory Visit: Payer: Self-pay | Admitting: Family Medicine

## 2019-06-07 ENCOUNTER — Ambulatory Visit (INDEPENDENT_AMBULATORY_CARE_PROVIDER_SITE_OTHER): Payer: Medicare Other | Admitting: *Deleted

## 2019-06-07 ENCOUNTER — Other Ambulatory Visit: Payer: Self-pay

## 2019-06-07 DIAGNOSIS — Z23 Encounter for immunization: Secondary | ICD-10-CM | POA: Diagnosis not present

## 2019-06-07 NOTE — Progress Notes (Signed)
Pt tolerated vaccine well. Deseree Blount, CMA  

## 2019-07-05 ENCOUNTER — Other Ambulatory Visit: Payer: Self-pay | Admitting: Family Medicine

## 2019-07-26 ENCOUNTER — Other Ambulatory Visit: Payer: Self-pay

## 2019-07-26 DIAGNOSIS — Z20822 Contact with and (suspected) exposure to covid-19: Secondary | ICD-10-CM

## 2019-07-28 LAB — NOVEL CORONAVIRUS, NAA: SARS-CoV-2, NAA: NOT DETECTED

## 2019-08-16 ENCOUNTER — Other Ambulatory Visit: Payer: Self-pay

## 2019-08-16 ENCOUNTER — Other Ambulatory Visit: Payer: Self-pay | Admitting: Family Medicine

## 2019-08-16 ENCOUNTER — Ambulatory Visit
Admission: RE | Admit: 2019-08-16 | Discharge: 2019-08-16 | Disposition: A | Discharge status: Home / Self Care | Payer: Medicare Other | Source: Ambulatory Visit | Attending: *Deleted | Admitting: *Deleted

## 2019-08-16 DIAGNOSIS — Z1231 Encounter for screening mammogram for malignant neoplasm of breast: Secondary | ICD-10-CM

## 2019-08-25 ENCOUNTER — Other Ambulatory Visit: Payer: Self-pay

## 2019-08-25 DIAGNOSIS — Z20822 Contact with and (suspected) exposure to covid-19: Secondary | ICD-10-CM

## 2019-08-27 ENCOUNTER — Other Ambulatory Visit: Payer: Self-pay

## 2019-08-27 ENCOUNTER — Ambulatory Visit (INDEPENDENT_AMBULATORY_CARE_PROVIDER_SITE_OTHER): Payer: Medicare Other | Admitting: Family Medicine

## 2019-08-27 VITALS — BP 110/80 | HR 86 | Wt 165.0 lb

## 2019-08-27 DIAGNOSIS — K600 Acute anal fissure: Secondary | ICD-10-CM | POA: Diagnosis not present

## 2019-08-27 DIAGNOSIS — K602 Anal fissure, unspecified: Secondary | ICD-10-CM | POA: Diagnosis not present

## 2019-08-27 LAB — NOVEL CORONAVIRUS, NAA: SARS-CoV-2, NAA: NOT DETECTED

## 2019-08-27 MED ORDER — LIDOCAINE HCL URETHRAL/MUCOSAL 2 % EX GEL: 1.0000 "application " | CUTANEOUS | 1 refills | Status: DC | PRN

## 2019-08-27 MED ORDER — NITROGLYCERIN 0.4 % RE OINT: 1.0000 "application " | TOPICAL_OINTMENT | Freq: Two times a day (BID) | RECTAL | 1 refills | Status: DC

## 2019-08-27 NOTE — Progress Notes (Signed)
   Subjective:    Patient ID: Cindy [name], female    DOB: 12-18-1975, 43 y.o.   MRN: [id number]   CC: Concern for rectal bleeding  HPI: Ms. Cindy [name] is a 43 year old female with epilepsy, intellectual disability, prediabetes, hyperlipidemia, and history of uterine mass presenting with her caretaker to discuss the following:  Rectal bleeding: Initially noticed on 12/2 after straining quite some time for a bowel movement that day, she was with her parents over the Thanksgiving holiday and told her mother she noticed bright blood on the outer portion of her bowel movement.  Noted about a teaspoon of bright blood.  Since that time she has had a little bit of dried blood on her panty liner and on the top of her bowel movements.  Notes stinging sensation with each bowel movement.  She denies any melena or frank hematochezia, hematuria.  No associated abdominal pain, nausea, vomiting, food insensitivities.  No fever or rashing in this area.  Endorses usually having a bowel movement on a daily basis, normally soft. She is on continuous OCPs without breaking for placebo week, does not have any vaginal spotting currently.  Has not had this before.  Denies any known family history of IBD or colon cancers.  She otherwise has been feeling well.   She is not sexually active.  She denies any unwanted sexual contact or touching in this region.  She feels safe in her relationships and at her group home/parents home.  This was discussed with the patient by herself with caretaker not in the room.  Smoking status reviewed  Review of Systems Per HPI    Objective:  BP 110/80   Pulse 86   Wt 165 lb (74.8 kg)   SpO2 98%   BMI 30.18 kg/m  Vitals and nursing note reviewed  General: NAD, pleasant Respiratory: Unlabored breathing Abdomen: soft, nontender, nondistended, no hepatosplenomegaly palpated Extremities: no edema or cyanosis. WWP. Skin: warm and dry, no rashes noted Rectal exam: At anal  opening, noted at about the 3 o'clock position in approximate 1 cm anal fissure with dried blood in the center.  No external hemorrhoids appreciated.  No surrounding swelling or erythema. Neuro: alert and oriented, no focal deficits Psych: Cooperative with exam, pleasant, able to answer questions appropriately  Assessment & Plan:   Anal fissure Atypical in position and quite large.  Suspect likely is in the setting of straining with hard stool the day of onset, however given atypical nature must consider underlying IBD/Crohn's but do suspect this is less likely without any additional GI symptoms. No sexual contact.  Low concern for GI bleeding given symptomatology, however must consider this if change of presentation.  Will treat with topical lidocaine and nitroglycerin.  Recommended maintaining soft stools with drinking plenty of water and using stool softeners as needed.  Will have her follow-up in 2 weeks for recheck to ensure improvement and proceed with shared decision making conversation on possible referral to GI with concerns of above.    Follow-up in 2 weeks or sooner if needed  [place] Resident PGY-2

## 2019-08-27 NOTE — Patient Instructions (Signed)
It was so wonderful meeting you both today.  I believe she has something called an anal fissure, a small cut at the outer opening of her rectum.  We can try 2 different topical therapies, 1 for pain relief, and the additional to help heal this region.  The topical lidocaine is to help for pain and the nitroglycerin is to help heal the region, you can use these for several weeks to help promote healing.  In addition, 1 to make sure that she is drinking plenty of water and has regular soft bowel movements, you can use MiraLAX or fiber supplements over-the-counter to make sure that she is having consistent soft bowel movements.  If she continues to have bleeding over the neck several weeks or has worsening including severe bright red blood or darkened stools, abdominal pain, change in weight, fatigue--these are reasons to follow-up.

## 2019-08-29 ENCOUNTER — Encounter: Payer: Self-pay | Admitting: Family Medicine

## 2019-08-29 DIAGNOSIS — K602 Anal fissure, unspecified: Secondary | ICD-10-CM | POA: Insufficient documentation

## 2019-08-29 NOTE — Assessment & Plan Note (Signed)
Atypical in position and quite large.  Suspect likely is in the setting of straining with hard stool the day of onset, however given atypical nature must consider underlying IBD/Crohn's but do suspect this is less likely without any additional GI symptoms. No sexual contact.  Low concern for GI bleeding given symptomatology, however must consider this if change of presentation.  Will treat with topical lidocaine and nitroglycerin.  Recommended maintaining soft stools with drinking plenty of water and using stool softeners as needed.  Will have her follow-up in 2 weeks for recheck to ensure improvement and proceed with shared decision making conversation on possible referral to GI with concerns of above.

## 2019-09-10 ENCOUNTER — Other Ambulatory Visit: Payer: Self-pay

## 2019-09-10 ENCOUNTER — Ambulatory Visit (INDEPENDENT_AMBULATORY_CARE_PROVIDER_SITE_OTHER): Payer: Medicare Other | Admitting: Family Medicine

## 2019-09-10 ENCOUNTER — Encounter: Payer: Self-pay | Admitting: Family Medicine

## 2019-09-10 VITALS — BP 118/68 | HR 87 | Ht 62.0 in | Wt 163.0 lb

## 2019-09-10 DIAGNOSIS — K602 Anal fissure, unspecified: Secondary | ICD-10-CM

## 2019-09-10 NOTE — Assessment & Plan Note (Signed)
Patient was resolution of anal fissure.  Recommend discontinue lidocaine.  May reduce MiraLAX to as needed for constipation.  Patient to monitor for any return of rectal bleeding or pain as needed.

## 2019-09-10 NOTE — Progress Notes (Signed)
    Subjective:  Cindy [name] is a 43 y.o. female who presents to the [facility] today with a chief complaint of follow-up for rectal fissure.   HPI:  Patient was seen in the office on 12/04 for acute anal fissure.  She was prescribed lidocaine and MiraLAX for soft stools.  Patient reports that she has had resolution of pain with bowel movements.  She has not seen any blood in her stool.  Her stools been soft with MiraLAX.  Patient has history of ID and her caregiver here is with her and she confirms her story.  Patient has been eating well and drinking plenty of water to ensure her stools remain soft.  Denies any fatigue or shortness of breath.  ROS: Per HPI   Objective:  Physical Exam: BP 118/68   Pulse 87   Ht 5\' 2"  (1.575 m)   Wt 163 lb (73.9 kg)   SpO2 98%   BMI 29.81 kg/m   Gen: NAD, resting comfortably CV: RRR with no murmurs appreciated Pulm: NWOB, CTAB with no crackles, wheezes, or rhonchi GI: Soft, Nontender, Nondistended. Rectal: There is no evidence of fissure, normal-appearing rectal area Skin: warm, dry Neuro: grossly normal, moves all extremities Psych: Normal affect and thought content  Chaperoned by Luci Bank  No results found for this or any previous visit (from the past 72 hour(s)).   Assessment/Plan:  Anal fissure Patient was resolution of anal fissure.  Recommend discontinue lidocaine.  May reduce MiraLAX to as needed for constipation.  Patient to monitor for any return of rectal bleeding or pain as needed.   Lab Orders  No laboratory test(s) ordered today    No orders of the defined types were placed in this encounter.     Marny Lowenstein, MD, MS FAMILY MEDICINE RESIDENT - PGY3 09/10/2019 2:06 PM

## 2019-09-10 NOTE — Patient Instructions (Signed)
It was a pleasure to see you today! Thank you for choosing [facility] for your primary care. Cindy [name] was seen for rectal pain.    You are improved. Things are healing well. You can stop using the lidocaine cream. You may cut back on the miralax to as needed for constipation. Please continue to drink plenty of water.   Best,  Marny Lowenstein, MD, MS FAMILY MEDICINE RESIDENT - PGY3 09/10/2019 9:39 AM

## 2019-09-21 ENCOUNTER — Ambulatory Visit: Payer: Medicare Other | Attending: Internal Medicine

## 2019-09-21 DIAGNOSIS — U071 COVID-19: Secondary | ICD-10-CM

## 2019-09-23 LAB — NOVEL CORONAVIRUS, NAA: SARS-CoV-2, NAA: NOT DETECTED

## 2019-10-27 ENCOUNTER — Ambulatory Visit (INDEPENDENT_AMBULATORY_CARE_PROVIDER_SITE_OTHER): Payer: Medicare Other | Admitting: Family Medicine

## 2019-10-27 ENCOUNTER — Encounter: Payer: Self-pay | Admitting: Family Medicine

## 2019-10-27 ENCOUNTER — Other Ambulatory Visit: Payer: Self-pay

## 2019-10-27 VITALS — BP 110/62 | HR 83 | Wt 161.0 lb

## 2019-10-27 DIAGNOSIS — R7303 Prediabetes: Secondary | ICD-10-CM

## 2019-10-27 DIAGNOSIS — Z0000 Encounter for general adult medical examination without abnormal findings: Secondary | ICD-10-CM | POA: Diagnosis not present

## 2019-10-27 DIAGNOSIS — E785 Hyperlipidemia, unspecified: Secondary | ICD-10-CM

## 2019-10-27 DIAGNOSIS — Z01419 Encounter for gynecological examination (general) (routine) without abnormal findings: Secondary | ICD-10-CM

## 2019-10-27 LAB — POCT GLYCOSYLATED HEMOGLOBIN (HGB A1C): Hemoglobin A1C: 5.9 % — AB (ref 4.0–5.6)

## 2019-10-27 MED ORDER — FORA V30A BLOOD GLUCOSE TEST VI STRP: ORAL_STRIP | 11 refills | Status: DC

## 2019-10-27 MED ORDER — VITAMIN D3 25 MCG (1000 UNIT) PO TABS: ORAL_TABLET | ORAL | 11 refills | Status: DC

## 2019-10-27 MED ORDER — NORGESTIMATE-ETH ESTRADIOL 0.25-35 MG-MCG PO TABS: ORAL_TABLET | ORAL | 11 refills | Status: DC

## 2019-10-27 MED ORDER — SIMVASTATIN 40 MG PO TABS: 40.0000 mg | ORAL_TABLET | Freq: Every day | ORAL | 11 refills | Status: DC

## 2019-10-27 MED ORDER — OYSTER SHELL CALCIUM 500 MG PO TABS: 1.0000 | ORAL_TABLET | Freq: Two times a day (BID) | ORAL | 11 refills | Status: DC

## 2019-10-27 MED ORDER — FORA LANCETS MISC: 11 refills | Status: DC

## 2019-10-27 MED ORDER — CEROVITE ADVANCED FORMULA PO TABS: ORAL_TABLET | ORAL | 11 refills | Status: DC

## 2019-10-27 MED ORDER — TETANUS-DIPHTH-ACELL PERTUSSIS 5-2.5-18.5 LF-MCG/0.5 IM SUSP: 0.5000 mL | Freq: Once | INTRAMUSCULAR | 0 refills | Status: AC

## 2019-10-27 MED ORDER — CETIRIZINE HCL 10 MG PO TABS: ORAL_TABLET | ORAL | 12 refills | Status: DC

## 2019-10-27 MED ORDER — MAGNESIUM OXIDE 250 MG PO TABS: ORAL_TABLET | ORAL | 11 refills | Status: DC

## 2019-10-27 NOTE — Patient Instructions (Addendum)
Thank you for coming to see me today. It was a pleasure! Today we talked about:   I will release your results on MyChart. Have a safe trip and wash your hands and wear a mask.   Please follow-up with me in 4 months or sooner as needed.  If you have any questions or concerns, please do not hesitate to call the office at [phone].  Take Care,   [place] Tyeson [place], DO

## 2019-10-27 NOTE — Progress Notes (Signed)
   HPI: Annual Physical  Patient presents today for an annual physical.   Concerns today: none Periods: regular on OCPs Contraception: OCPs Pelvic symptoms: none STD Screening: deferred Pap smear status: up to date, normal 04/09/2017, will need repeat  Exercise: walking daily Diet: eating healthy Smoking: never Alcohol: no Drugs: no Mood: good, no concern Dentist: has one  ROS: See HPI  [place]:  Cancers in family: Grandmother had breast cancer  PHYSICAL EXAM: BP 110/62   Pulse 83   Wt 161 lb (73 kg)   SpO2 98%   BMI 29.45 kg/m  Gen: NAD, pleasant, cooperative HEENT: NCAT, PERRL Heart: RRR, no murmurs Lungs: CTAB, NWOB Abdomen: soft, nontender to palpation Neuro: grossly nonfocal, speech normal Gait: normal gait  ASSESSMENT/PLAN:  # Health maintenance:  -pap smear: up to date -mammogram: n/a -lipid screening: will perform today, on simvastatin -DEXA: n/a -immunizations: Tdap sent to pharamacy to be competed 14 days after receiving second dose of covid vaccine -handout given on health maintenance topics  # Prediabetes: diet controlled. A1c remains 5.9. Patient happy to say she has lost 2 pounds. Checking her sugar bi-weekly to ensure no change.   # Seizures: managed by neurology. Has been seizure-free for 6 years now.   FOLLOW UP: Follow up in 4 months for FL2 form completion  [place] Fielding Mault, DO PGY-3, [place]

## 2019-10-28 LAB — BASIC METABOLIC PANEL
BUN/Creatinine Ratio: 14 (ref 9–23)
BUN: 12 mg/dL (ref 6–24)
CO2: 26 mmol/L (ref 20–29)
Calcium: 10.2 mg/dL (ref 8.7–10.2)
Chloride: 103 mmol/L (ref 96–106)
Creatinine, Ser: 0.88 mg/dL (ref 0.57–1.00)
GFR calc Af Amer: 93 mL/min/{1.73_m2} (ref >59)
GFR calc non Af Amer: 81 mL/min/{1.73_m2} (ref >59)
Glucose: 109 mg/dL — ABNORMAL HIGH (ref 65–99)
Potassium: 4.8 mmol/L (ref 3.5–5.2)
Sodium: 142 mmol/L (ref 134–144)

## 2019-10-28 LAB — LIPID PANEL
Chol/HDL Ratio: 2.6 ratio (ref 0.0–4.4)
Cholesterol, Total: 162 mg/dL (ref 100–199)
HDL: 63 mg/dL (ref >39)
LDL Chol Calc (NIH): 68 mg/dL (ref 0–99)
Triglycerides: 191 mg/dL — ABNORMAL HIGH (ref 0–149)
VLDL Cholesterol Cal: 31 mg/dL (ref 5–40)

## 2020-03-08 ENCOUNTER — Other Ambulatory Visit: Payer: Self-pay

## 2020-03-08 ENCOUNTER — Ambulatory Visit (INDEPENDENT_AMBULATORY_CARE_PROVIDER_SITE_OTHER): Payer: Medicare Other | Admitting: Dermatology

## 2020-03-08 ENCOUNTER — Encounter: Payer: Self-pay | Admitting: *Deleted

## 2020-03-08 DIAGNOSIS — L821 Other seborrheic keratosis: Secondary | ICD-10-CM | POA: Diagnosis not present

## 2020-03-08 DIAGNOSIS — D225 Melanocytic nevi of trunk: Secondary | ICD-10-CM | POA: Diagnosis not present

## 2020-03-08 DIAGNOSIS — D229 Melanocytic nevi, unspecified: Secondary | ICD-10-CM

## 2020-03-08 DIAGNOSIS — Z86018 Personal history of other benign neoplasm: Secondary | ICD-10-CM | POA: Diagnosis not present

## 2020-03-21 ENCOUNTER — Other Ambulatory Visit: Payer: Self-pay

## 2020-03-21 ENCOUNTER — Encounter: Payer: Self-pay | Admitting: Family Medicine

## 2020-03-21 ENCOUNTER — Ambulatory Visit (INDEPENDENT_AMBULATORY_CARE_PROVIDER_SITE_OTHER): Payer: Medicare Other | Admitting: Family Medicine

## 2020-03-21 DIAGNOSIS — F79 Unspecified intellectual disabilities: Secondary | ICD-10-CM | POA: Diagnosis present

## 2020-03-21 NOTE — Progress Notes (Signed)
   SUBJECTIVE:   CHIEF COMPLAINT / HPI:   Patient here in order to have FL 2 form completed.  She is currently denying any complaints or concerns.  Screenings are up-to-date.  Patient is happy that she has not gained any of the weight she lost back.  PERTINENT  PMH / PSH: No actual disability  OBJECTIVE:  BP 104/60   Pulse 76   Ht 5\' 2"  (1.575 m)   Wt 163 lb 6 oz (74.1 kg)   SpO2 98%   BMI 29.88 kg/m   General: NAD, pleasant Neck: Supple Respiratory: normal work of breathing Psych: AOx3, appropriate affect   ASSESSMENT/PLAN:   Intellectual disability FL 2 form reviewed and completed today    [place] Evens Meno, DO PGY-3, [place]

## 2020-03-21 NOTE — Assessment & Plan Note (Signed)
FL 2 form reviewed and completed today

## 2020-04-09 ENCOUNTER — Encounter: Payer: Self-pay | Admitting: Dermatology

## 2020-04-09 NOTE — Progress Notes (Signed)
   Follow-Up Visit   Subjective  Cindy [name] is a 44 y.o. female who presents for the following: Annual Exam (Check spot on scalp. Very irritating per patient. Also has a spot behind right ear that is a skin tag like bump. ).  Growth Location: Scalp Duration:  Quality:  Associated Signs/Symptoms: Modifying Factors:  Severity:  Timing: Context: History of atypical mole  Objective  Well appearing patient in no apparent distress; mood and affect are within normal limits.  All sun exposed areas plus back examined. plus scalp.   Assessment & Plan   There are no Patient Instructions on file for this visit.  Nevus Mid Back  Skin examination annually  Seborrheic keratosis Mid Parietal Scalp  Biopsy if changes     I, [name], MD, have reviewed all documentation for this visit.  The documentation on 04/09/20 for the exam, diagnosis, procedures, and orders are all accurate and complete.

## 2020-06-27 ENCOUNTER — Ambulatory Visit (INDEPENDENT_AMBULATORY_CARE_PROVIDER_SITE_OTHER): Payer: Medicare Other | Admitting: Family Medicine

## 2020-06-27 ENCOUNTER — Other Ambulatory Visit: Payer: Self-pay

## 2020-06-27 ENCOUNTER — Encounter: Payer: Self-pay | Admitting: Family Medicine

## 2020-06-27 VITALS — BP 120/82 | HR 74 | Wt 165.0 lb

## 2020-06-27 DIAGNOSIS — Z23 Encounter for immunization: Secondary | ICD-10-CM

## 2020-06-27 DIAGNOSIS — M79603 Pain in arm, unspecified: Secondary | ICD-10-CM

## 2020-06-27 MED ORDER — IBUPROFEN 200 MG PO TABS: 400.0000 mg | ORAL_TABLET | Freq: Four times a day (QID) | ORAL | 0 refills | Status: DC | PRN

## 2020-06-27 MED ORDER — IBUPROFEN 200 MG PO TABS: 400.0000 mg | ORAL_TABLET | Freq: Once | ORAL | Status: DC

## 2020-06-27 NOTE — Progress Notes (Signed)
Duplicate

## 2020-06-27 NOTE — Progress Notes (Signed)
   SUBJECTIVE:   CHIEF COMPLAINT / HPI:   Chief Complaint  Patient presents with  . discuss covid Booster  . Flu Vaccine     Cindy [name] is a 44 y.o. female here to discuss COVID booster.   Patient here with caregiver to discuss COVID booster. Pt residues in group home. Cindy [name] is apprehensive about getting booster as she feels it "will hurt my arm." Pt expressed the booster will help her. She is upset that the pandemic interrupted her swimming schedule. She does not fear getting the flu vaccine today. All questions asked and answered.     PERTINENT  PMH / PSH: reviewed and updated as appropriate   OBJECTIVE:   BP 120/82   Pulse 74   Wt 165 lb (74.8 kg)   SpO2 98%   BMI 30.18 kg/m   GEN: anxious female in no acute distress  CV: regular rate and rhythm RESP: no increased work of breathing, clear to ascultation bilaterally  MSK: moves all extremities appropriately SKIN: warm, dry    ASSESSMENT/PLAN:   Encounter for immunization Patient received COVID booster. Discussed risks and benefits with patient and caregiver. Pt with prediabetes, obesity and epilepsy. She received her COVID vaccines: 10/06/19 and 11/01/19. Ibuprofen was given for arm pain.      Cindy Hensen, DO PGY-2, [facility] 06/27/2020

## 2020-06-27 NOTE — Patient Instructions (Signed)
It was great seeing you today!   I'd like to see you back for any new issues we're happy to fit you in, just give [place] a call!   Give Ibuprofen or Tylenol as needed for pain related to COVID vaccine and/or fever.   If you have questions or concerns please do not hesitate to call at [phone].  Dr. Rushie [facility]

## 2020-07-03 DIAGNOSIS — Z23 Encounter for immunization: Secondary | ICD-10-CM | POA: Insufficient documentation

## 2020-07-03 NOTE — Assessment & Plan Note (Signed)
Patient received COVID booster. Discussed risks and benefits with patient and caregiver. Pt with prediabetes, obesity and epilepsy. She received her COVID vaccines: 10/06/19 and 11/01/19. Ibuprofen was given for arm pain.

## 2020-07-20 ENCOUNTER — Other Ambulatory Visit: Payer: Self-pay | Admitting: Family Medicine

## 2020-07-20 DIAGNOSIS — M79603 Pain in arm, unspecified: Secondary | ICD-10-CM

## 2020-10-06 ENCOUNTER — Other Ambulatory Visit: Payer: Self-pay | Admitting: Family Medicine

## 2020-10-11 ENCOUNTER — Other Ambulatory Visit: Payer: Self-pay

## 2020-10-11 MED ORDER — FORA LANCETS MISC: 11 refills | Status: DC

## 2020-10-17 ENCOUNTER — Other Ambulatory Visit: Payer: Self-pay | Admitting: Family Medicine

## 2020-12-21 ENCOUNTER — Telehealth: Payer: Self-pay

## 2020-12-21 NOTE — Telephone Encounter (Signed)
Pharmacy calls nurse line regarding calcium 500 mg rx. Pharmacist reports that they have 2 prescriptions, one for calcium 500 mg and another for calcium 500 mg plus vitamin D. Please advise which prescription should be dispensed for patient.   Talbot Grumbling, RN

## 2020-12-22 NOTE — Telephone Encounter (Signed)
Called pharmacy and informed of below.   Talbot Grumbling, RN

## 2021-01-24 ENCOUNTER — Encounter: Payer: Self-pay | Admitting: Family Medicine

## 2021-01-24 ENCOUNTER — Other Ambulatory Visit: Payer: Self-pay

## 2021-01-24 ENCOUNTER — Ambulatory Visit (INDEPENDENT_AMBULATORY_CARE_PROVIDER_SITE_OTHER): Payer: Medicare Other | Admitting: Family Medicine

## 2021-01-24 VITALS — BP 118/74 | HR 83 | Ht 62.0 in | Wt 165.2 lb

## 2021-01-24 DIAGNOSIS — E785 Hyperlipidemia, unspecified: Secondary | ICD-10-CM

## 2021-01-24 DIAGNOSIS — H6122 Impacted cerumen, left ear: Secondary | ICD-10-CM | POA: Diagnosis not present

## 2021-01-24 DIAGNOSIS — R7303 Prediabetes: Secondary | ICD-10-CM

## 2021-01-24 DIAGNOSIS — Z025 Encounter for examination for participation in sport: Secondary | ICD-10-CM | POA: Insufficient documentation

## 2021-01-24 LAB — POCT GLYCOSYLATED HEMOGLOBIN (HGB A1C): HbA1c, POC (controlled diabetic range): 6.2 % (ref 0.0–7.0)

## 2021-01-24 NOTE — Assessment & Plan Note (Signed)
Removed significant amount of impacted cerumen with curette and irrigation.  Still unable to visualize tympanic membrane.  Patient more appropriate for ENT referral for cerumen removal.  Not significantly affecting hearing according to patient.

## 2021-01-24 NOTE — Assessment & Plan Note (Signed)
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  Form was completed and returned

## 2021-01-24 NOTE — Assessment & Plan Note (Signed)
On Zocor.  Getting lipid panel today.

## 2021-01-24 NOTE — Assessment & Plan Note (Signed)
Getting A1c today.

## 2021-01-24 NOTE — Patient Instructions (Signed)
It was nice to see you today,  I have filled out Cindy [name]'s paperwork.  She is eligible to participate in the [employer].  I have ordered a A1c and lipid panel on her.  I will call you with results when I get them.  I put in a referral to ear nose and throat for cleaning of the left ear.  Please follow-up with your PCP as needed.  Have a great day,  Clemetine Marker, MD

## 2021-01-24 NOTE — Progress Notes (Signed)
    SUBJECTIVE:   CHIEF COMPLAINT / HPI:   Patient is here today for physical for the [employer].  Patient states she has been doing the [employer] for over 30 years.  Does mostly swimming events.  Has not done it in the last 2 years due to [place].  Patient's med list is correct except for the fact that she is taking 1250 mg of Keppra twice daily and not 1500 mg.  Accompanied by Posey Rea.  Patient has a history of high cholesterol, prediabetes.  PERTINENT  PMH / PSH: epilepsy  OBJECTIVE:   BP 118/74   Pulse 83   Ht 5\' 2"  (1.575 m)   Wt 165 lb 3.2 oz (74.9 kg)   SpO2 97%   BMI 30.22 kg/m   General: Alert and oriented.  No acute distress. HEENT: PERRLA, EOMI, moist mucosa.  Right TM normal.  Left TM not visualized due to significant obstructing cerumen.  Ear canal was cleaned of a large amount of cerumen but there was still obstructing cerumen after curette and irrigation. CV: Regular rate and rhythm, no murmurs Pulmonary: Lungs clear auscultation bilaterally, no wheezes or crackles. GI: Soft, nontender palpation.  Normal bowel sounds. MSK: 5/5 strength upper and lower extremities bilaterally.  Normal range of motion. Psych: Patient able to carry on normal conversation.  Pleasant affect.  ASSESSMENT/PLAN:   Prediabetes Getting A1c today.  HLD (hyperlipidemia) On Zocor.  Getting lipid panel today.  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  Form was completed and returned  Impacted cerumen of left ear Removed significant amount of impacted cerumen with curette and irrigation.  Still unable to visualize tympanic membrane.  Patient more appropriate for ENT referral for cerumen removal.  Not significantly affecting hearing according to patient.     Benay Pike, MD [place]

## 2021-01-25 LAB — LIPID PANEL
Chol/HDL Ratio: 2.8 ratio (ref 0.0–4.4)
Cholesterol, Total: 181 mg/dL (ref 100–199)
HDL: 65 mg/dL (ref >39)
LDL Chol Calc (NIH): 79 mg/dL (ref 0–99)
Triglycerides: 227 mg/dL — ABNORMAL HIGH (ref 0–149)
VLDL Cholesterol Cal: 37 mg/dL (ref 5–40)

## 2021-02-27 ENCOUNTER — Other Ambulatory Visit: Payer: Self-pay | Admitting: Family Medicine

## 2021-03-12 ENCOUNTER — Other Ambulatory Visit: Payer: Self-pay | Admitting: Family Medicine

## 2021-03-14 ENCOUNTER — Ambulatory Visit: Payer: Medicare Other | Admitting: Dermatology

## 2021-03-20 ENCOUNTER — Other Ambulatory Visit: Payer: Self-pay | Admitting: Family Medicine

## 2021-04-10 ENCOUNTER — Other Ambulatory Visit: Payer: Self-pay

## 2021-04-10 ENCOUNTER — Ambulatory Visit (INDEPENDENT_AMBULATORY_CARE_PROVIDER_SITE_OTHER): Payer: Medicare Other | Admitting: Otolaryngology

## 2021-04-10 ENCOUNTER — Encounter (INDEPENDENT_AMBULATORY_CARE_PROVIDER_SITE_OTHER): Payer: Self-pay | Admitting: Otolaryngology

## 2021-04-10 VITALS — Temp 98.8°F

## 2021-04-10 DIAGNOSIS — H6123 Impacted cerumen, bilateral: Secondary | ICD-10-CM | POA: Diagnosis not present

## 2021-04-10 NOTE — Progress Notes (Signed)
HPI: Cindy [name] is a 45 y.o. female who presents for evaluation of wax buildup in her ears.  Especially on the left side.  She is referred by her PCP.  She does not use hearing aids but does use ear buds frequently.  Past Medical History:  Diagnosis Date   Atypical mole 09/06/2014   atypiucal nevus- left cheek-wider shave   Seizures ([place])    Past Surgical History:  Procedure Laterality Date   cyber onyx implant  03/2016   help control sizures    Social History   Socioeconomic History   Marital status: Single    Spouse name: Not on file   Number of children: Not on file   Years of education: Not on file   Highest education level: Not on file  Occupational History   Not on file  Tobacco Use   Smoking status: Never   Smokeless tobacco: Never  Substance and Sexual Activity   Alcohol use: No   Drug use: No   Sexual activity: Never    Birth control/protection: Pill  Other Topics Concern   Not on file  Social History Narrative   [facility] age 22[facility]41.    Never smoker, no alcohol or other drugs   Never been sexually active   Social Determinants of Health   Financial Resource Strain: Not on file  Food Insecurity: Not on file  Transportation Needs: Not on file  Physical Activity: Not on file  Stress: Not on file  Social Connections: Not on file   Family History  Problem Relation Age of Onset   High Cholesterol Mother    High Cholesterol Father    Other Father        Pacemaker    Breast cancer Paternal Grandmother    No Known Allergies Prior to Admission medications   Medication Sig Start Date End Date Taking? Authorizing Provider  norgestimate-ethinyl estradiol (SPRINTEC 28) 0.25-35 MG-MCG tablet TAKE 1 TABLET BY MOUTH ONCE DAILY (SKIPPING PLACEBO PILLS) 02/27/21   Brimage, Vondra, DO  Blood Glucose Monitoring Suppl (FORA V30A BLOOD GLUCOSE SYSTEM) w/Device KIT by Does not apply route.    [provider]  cetirizine (ZYRTEC) 10 MG  tablet TAKE 1 TABLET BY MOUTH EVERY DAY 03/20/21   Lyndee Hensen, DO  cholecalciferol (VITAMIN D) 25 MCG (1000 UNIT) tablet TAKE 2 TABLETS BY MOUTH EVERY DAY 03/20/21   Brimage, Vondra, DO  escitalopram (LEXAPRO) 20 MG tablet Take 30 mg by mouth daily.    [provider]  FORA Lancets MISC USE TO TEST BLOOD SUGAR ONCE DAILY. 10/11/20   Brimage, Ronnette Juniper, DO  FORA V30A BLOOD GLUCOSE TEST test strip USE TO TEST BLOOD SUGAR ONCE DAILY. 10/07/20   Brimage, Ronnette Juniper, DO  ibuprofen (ADVIL) 400 MG tablet TAKE 1 TABLET (=$RemoveBe'400MG'lGrNPyPbv$ ) BY MOUTH EVERY 6 HOURS AS NEEDED FOR MILD PAIN. 07/20/20   Brimage, Ronnette Juniper, DO  levETIRAcetam (KEPPRA) 500 MG tablet Take 1,250 mg by mouth 2 (two) times daily.    [provider]  Magnesium Oxide -Mg Supplement 250 MG TABS TAKE 1 TABLET BY MOUTH ONCE DAILY 03/13/21   Brimage, Ronnette Juniper, DO  Multiple Vitamins-Minerals (SENTRY) TABS TAKE 1 TABLET BY MOUTH EVERY DAY 03/20/21   Brimage, Ronnette Juniper, DO  Oyster Shell Calcium 500 MG TABS TAKE 1 TABLET BY MOUTH 2 TIMES DAILY WITH MEALS 03/20/21   Brimage, Ronnette Juniper, DO  simvastatin (ZOCOR) 40 MG tablet TAKE 1 TABLET BY MOUTH AT BEDTIME 03/20/21   Lyndee Hensen, DO  Positive ROS: Otherwise negative  All other systems have been reviewed and were otherwise negative with the exception of those mentioned in the HPI and as above.  Physical Exam: Constitutional: Alert, well-appearing, no acute distress Ears: External ears without lesions or tenderness.  Right ear canal with minimal wax buildup that is nonobstructing.  This was cleaned with a curette and the TM was clear.  The left ear canal was completely occluded with cerumen that was removed with a curette.  The TM is clear with clear ear canal otherwise. Nasal: External nose without lesions.. Clear nasal passages Oral: Lips and gums without lesions. Tongue and palate mucosa without lesions. Posterior oropharynx clear. Neck: No palpable adenopathy or masses Respiratory: Breathing  comfortably  Skin: No facial/neck lesions or rash noted.  Cerumen impaction removal  Date/Time: 04/10/2021 2:25 PM Performed by: Rozetta Nunnery, MD Authorized by: Rozetta Nunnery, MD   Consent:    Consent obtained:  Verbal   Consent given by:  Patient   Risks discussed:  Pain and bleeding Procedure details:    Location:  L ear and R ear   Procedure type: curette   Post-procedure details:    Inspection:  TM intact and canal normal   Hearing quality:  Improved   Procedure completion:  Tolerated well, no immediate complications Comments:     She had a large amount of wax in the left ear canal that was obstructing the left ear canal.  She has small amount of wax in the right ear canal.  Ear canals were cleaned with curettes and the TMs were clear bilaterally.  Assessment: Cerumen impaction worse on the left side  Plan: This was cleaned in the office. She will follow-up as needed.  [name], MD

## 2021-04-12 ENCOUNTER — Other Ambulatory Visit: Payer: Self-pay | Admitting: Family Medicine

## 2021-04-16 ENCOUNTER — Encounter: Payer: Self-pay | Admitting: Dermatology

## 2021-04-16 ENCOUNTER — Other Ambulatory Visit: Payer: Self-pay

## 2021-04-16 ENCOUNTER — Ambulatory Visit (INDEPENDENT_AMBULATORY_CARE_PROVIDER_SITE_OTHER): Payer: Medicare Other | Admitting: Dermatology

## 2021-04-16 DIAGNOSIS — L821 Other seborrheic keratosis: Secondary | ICD-10-CM

## 2021-04-16 DIAGNOSIS — R21 Rash and other nonspecific skin eruption: Secondary | ICD-10-CM

## 2021-04-16 DIAGNOSIS — Z1283 Encounter for screening for malignant neoplasm of skin: Secondary | ICD-10-CM

## 2021-04-16 DIAGNOSIS — D2272 Melanocytic nevi of left lower limb, including hip: Secondary | ICD-10-CM

## 2021-04-16 DIAGNOSIS — D485 Neoplasm of uncertain behavior of skin: Secondary | ICD-10-CM

## 2021-04-16 DIAGNOSIS — Z86018 Personal history of other benign neoplasm: Secondary | ICD-10-CM | POA: Diagnosis not present

## 2021-04-16 MED ORDER — TRIAMCINOLONE ACETONIDE 0.1 % EX CREA: TOPICAL_CREAM | CUTANEOUS | 3 refills | Status: DC

## 2021-04-16 NOTE — Patient Instructions (Signed)

## 2021-04-22 ENCOUNTER — Encounter: Payer: Self-pay | Admitting: Dermatology

## 2021-04-22 NOTE — Progress Notes (Signed)
   Follow-Up Visit   Subjective  Cindy [name] is a 45 y.o. female who presents for the following: Annual Exam (Dr Marge Duncans has note from mom to check her back and waist for itch areas. Patient has a aid with her today at the visit. Patient has history of a atypical mole left cheek wider shave ).  General skin examination, 1 lesion left abdomen is changed and gets sore Location:  Duration:  Quality:  Associated Signs/Symptoms: Modifying Factors:  Severity:  Timing: Context:   Objective  Well appearing patient in no apparent distress; mood and affect are within normal limits. Mid Back Back and left waist line itch: Subtly keratotic flattopped tan 5 mm papules.  Normal dermoscopy. No atypical moles found today   Mid Back [name] with patient today waist up exam (areas beneath undergarments not examined).  Mid Back Lower back and waist line: Mild patchy chronic dermatitis compatible with eczema.  Left Hip Pearly exophytic 6 mm papule with some inflammation.  Patient herself requested this be removed.       All skin waist up examined.  Areas beneath undergarments not examined.   Assessment & Plan    Seborrheic keratosis Mid Back  No intervention necessary.  triamcinolone cream (KENALOG) 0.1 % - Mid Back Apply to back qd to as needed  Screening exam for skin cancer Mid Back  No atypical pigmented lesions.  Rash and other nonspecific skin eruption Mid Back  May use triamcinolone cream after bathing to itchy areas for 1 to 2 weeks.  Avoid use on face.  Related Medications triamcinolone cream (KENALOG) 0.1 % Apply to back qd to as needed  Neoplasm of uncertain behavior of skin Left Hip  Skin / nail biopsy Type of biopsy: tangential   Informed consent: discussed and consent obtained   Timeout: patient name, date of birth, surgical site, and procedure verified   Anesthesia: the lesion was anesthetized in a standard fashion   Anesthetic:   1% lidocaine w/ epinephrine 1-100,000 local infiltration Instrument used: flexible razor blade   Hemostasis achieved with: aluminum chloride and electrodesiccation   Outcome: patient tolerated procedure well   Post-procedure details: wound care instructions given    Specimen 1 - Surgical pathology Differential Diagnosis: tag mole  Check Margins: No      I, Cindy Monarch, MD, have reviewed all documentation for this visit.  The documentation on 04/22/21 for the exam, diagnosis, procedures, and orders are all accurate and complete.

## 2021-05-05 ENCOUNTER — Other Ambulatory Visit: Payer: Self-pay | Admitting: Family Medicine

## 2021-06-01 ENCOUNTER — Ambulatory Visit: Payer: Medicare Other | Admitting: Family Medicine

## 2021-06-25 ENCOUNTER — Other Ambulatory Visit: Payer: Self-pay

## 2021-06-25 ENCOUNTER — Ambulatory Visit (INDEPENDENT_AMBULATORY_CARE_PROVIDER_SITE_OTHER): Payer: Medicare Other | Admitting: Family Medicine

## 2021-06-25 VITALS — BP 151/87 | HR 80 | Ht 62.0 in | Wt 175.8 lb

## 2021-06-25 DIAGNOSIS — L298 Other pruritus: Secondary | ICD-10-CM | POA: Insufficient documentation

## 2021-06-25 DIAGNOSIS — Z23 Encounter for immunization: Secondary | ICD-10-CM | POA: Diagnosis not present

## 2021-06-25 MED ORDER — PERMETHRIN 0.5 % AERO: INHALATION_SPRAY | Freq: Once | 0 refills | Status: DC

## 2021-06-25 MED ORDER — PERMETHRIN 5 % EX CREA: 1.0000 "application " | TOPICAL_CREAM | Freq: Once | CUTANEOUS | 0 refills | Status: AC

## 2021-06-25 MED ORDER — HYDROXYZINE HCL 25 MG PO TABS: 25.0000 mg | ORAL_TABLET | Freq: Every evening | ORAL | 0 refills | Status: DC | PRN

## 2021-06-25 NOTE — Patient Instructions (Addendum)
I am not exactly sure what is causing the rash, but there can be several things that had a similar presentation.  At this time I think it would be best to delay treatment with permethrin in case this could be a similar presentation to scabies, which is hard to diagnose, just to be on the safe side.  If there is no improvement after this treatment within the next couple of days there we may need to reevaluate.  If there is improvement then repeat the treatment in 1 week.  If this continues beyond that time, come back to clinic as we will need to do some more testing, or you could consider also following up with your dermatologist.  I am also sending in a medication called Atarax (hydroxyzine), this medication is good to be for her itching, she can use it at night as it is when she is having the most.  This medication can also make you somewhat drowsy.

## 2021-06-25 NOTE — Assessment & Plan Note (Addendum)
Rash has been present for about 3 months, was treated initially with triamcinolone but continued to spread and worsen.  Rash is not characteristic of anything in particular and has no other associated symptoms other than pruritus.  At this time, cannot rule out scabies and given the risk of not treating we went ahead and treated with permethrin cream with strict instructions for return precautions if not improving in the next several days after application. Patient was also given hydroxyzine to be taken at night for itch relief. If there is no improvement the patient is to return and we can consider doing LFTs for cholangitis picture versus returning to dermatology for evaluation.  Did consider syphilis, HIV, infectious rash, intertrigo but not consistent with a history and physical exam at this time.

## 2021-06-25 NOTE — Progress Notes (Signed)
    SUBJECTIVE:   CHIEF COMPLAINT / HPI:   History was provided by patient and caregiver Letithia  Pruritic diffuse rash Patient presents with a pruritic rash that has been present since early July.  It initially started out as a small area on her body, she was seen by dermatology when getting a skin tags and mentioned it and was given triamcinolone.  There was minimal improvement in symptoms with using it and the rash began to spread over her body.  She now has bumps that are most prevalent on her inner thighs, on her buttocks as well as on her back and legs.  She states that she has been itching everywhere and has not tried any other medications.  She is currently on Zyrtec daily as a baseline medication.  Does not know of any new medications or products that were started at that time.  PERTINENT  PMH / PSH: Reviewed  OBJECTIVE:   BP (!) 151/87   Pulse 80   Ht 5\' 2"  (1.575 m)   Wt 175 lb 12.8 oz (79.7 kg)   SpO2 99%   BMI 32.15 kg/m   General: NAD, well-appearing, well-nourished Respiratory: No respiratory distress, breathing comfortably, able to speak in full sentences Skin: warm and dry, erythematous papular rash noted diffusely, more concentrated at waistband, inner thighs bilaterally, and more diffusely over arms and legs. Psych: Appropriate affect and mood  ASSESSMENT/PLAN:   Pruritic erythematous rash Rash has been present for about 3 months, was treated initially with triamcinolone but continued to spread and worsen.  Rash is not characteristic of anything in particular and has no other associated symptoms other than pruritus.  At this time, cannot rule out scabies and given the risk of not treating we went ahead and treated with permethrin cream with strict instructions for return precautions if not improving in the next several days after application. Patient was also given hydroxyzine to be taken at night for itch relief. If there is no improvement the patient is to return  and we can consider doing LFTs for cholangitis picture versus returning to dermatology for evaluation.  Did consider syphilis, HIV, infectious rash, intertrigo but not consistent with a history and physical exam at this time.    Rise Patience, [place]

## 2021-07-02 ENCOUNTER — Other Ambulatory Visit: Payer: Self-pay | Admitting: Family Medicine

## 2021-07-31 ENCOUNTER — Other Ambulatory Visit: Payer: Self-pay | Admitting: Family Medicine

## 2021-08-06 IMAGING — MG DIGITAL SCREENING BILAT W/ TOMO W/ CAD
8 series · 8 of 24 positions shown · non-contrast
Comparison: Previous exam(s).

CLINICAL DATA: Screening.

EXAM:
DIGITAL SCREENING BILATERAL MAMMOGRAM WITH TOMO AND CAD

[L MLO synth-2D]
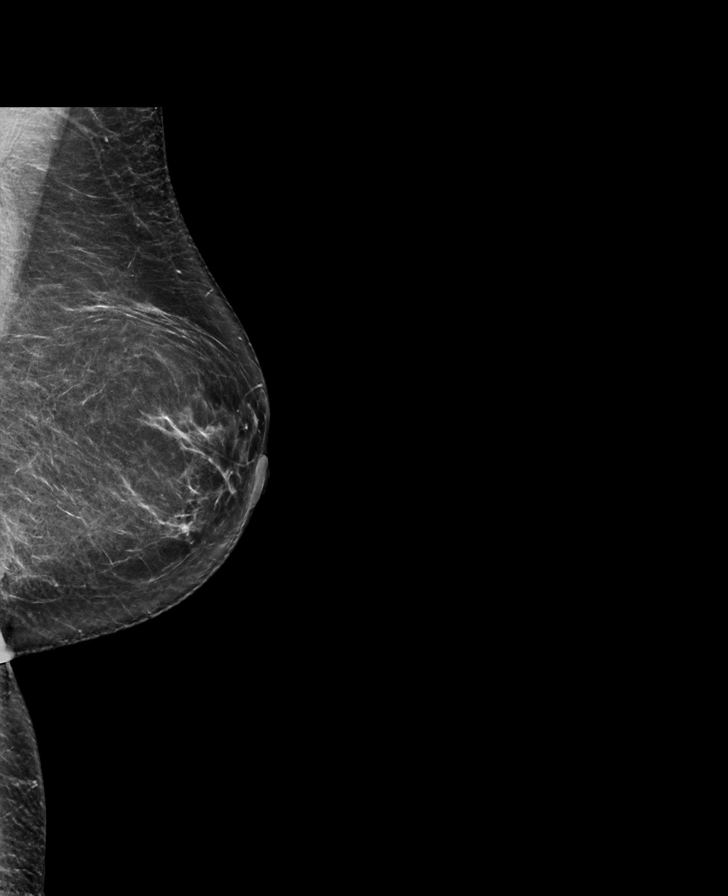

[L CC synth-2D]
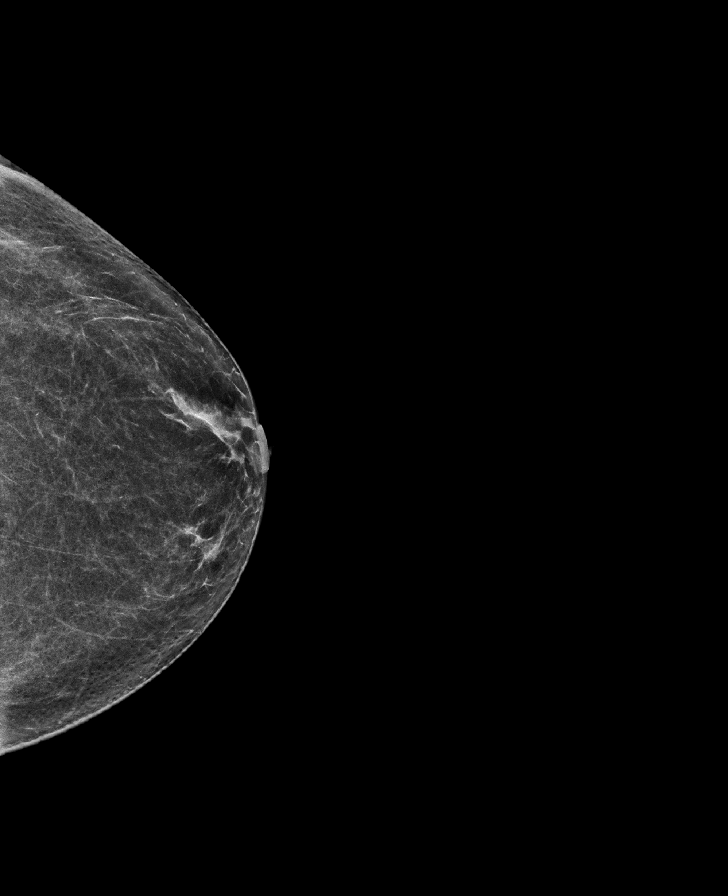

[R CC synth-2D]
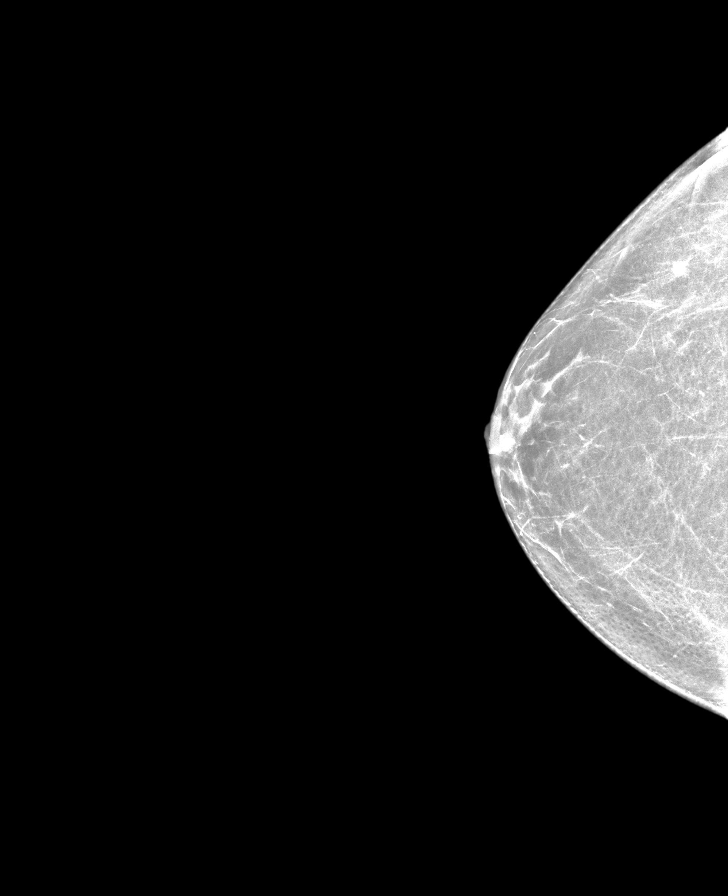

[R MLO synth-2D]
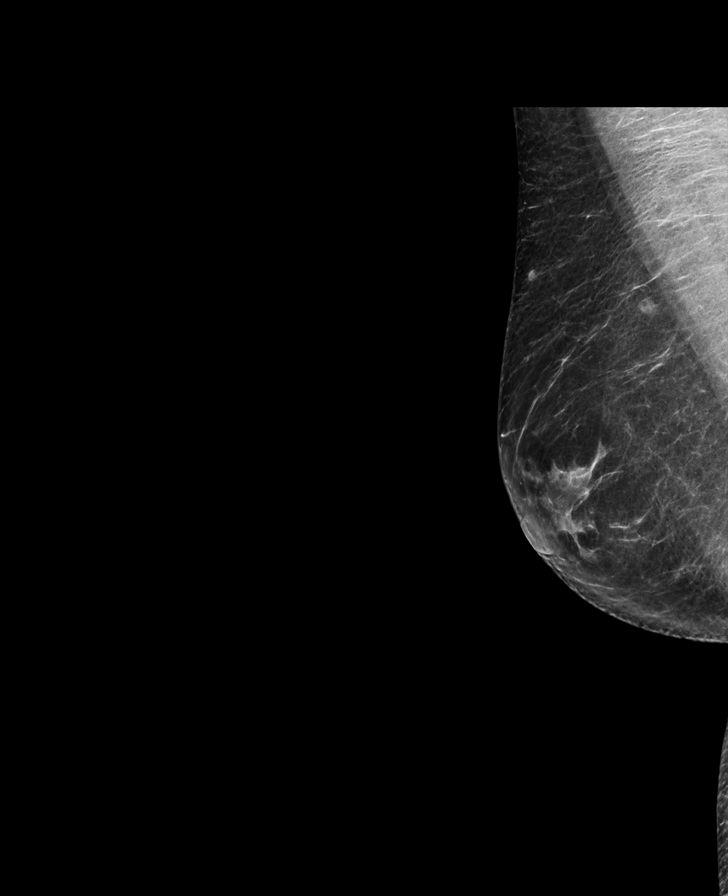

[R MLO tomo · tomo slice 36/71.0]
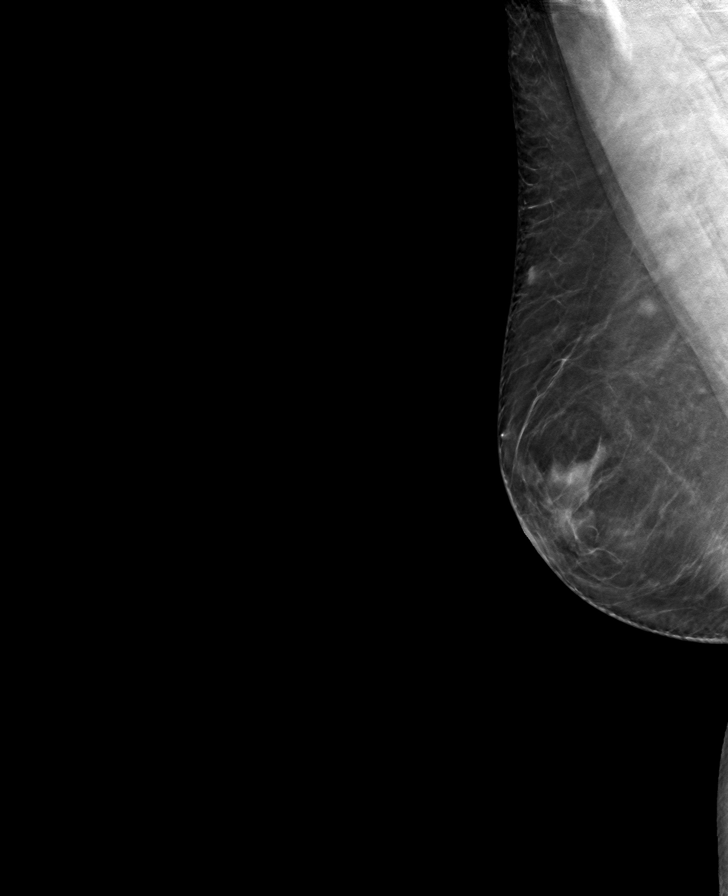

[L CC tomo · tomo slice 33/65.0]
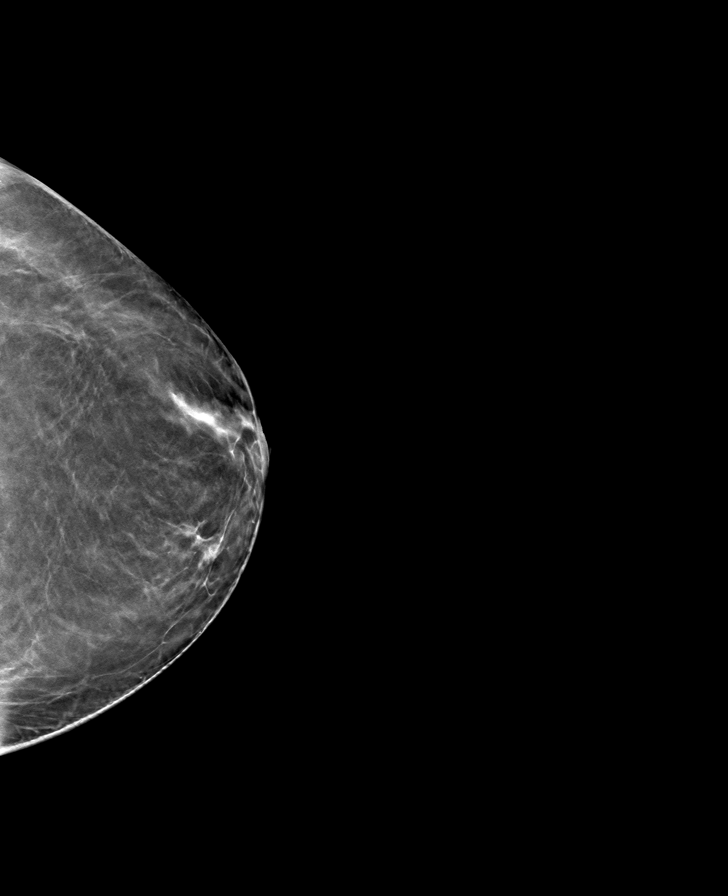

[L MLO tomo · tomo slice 37/73.0]
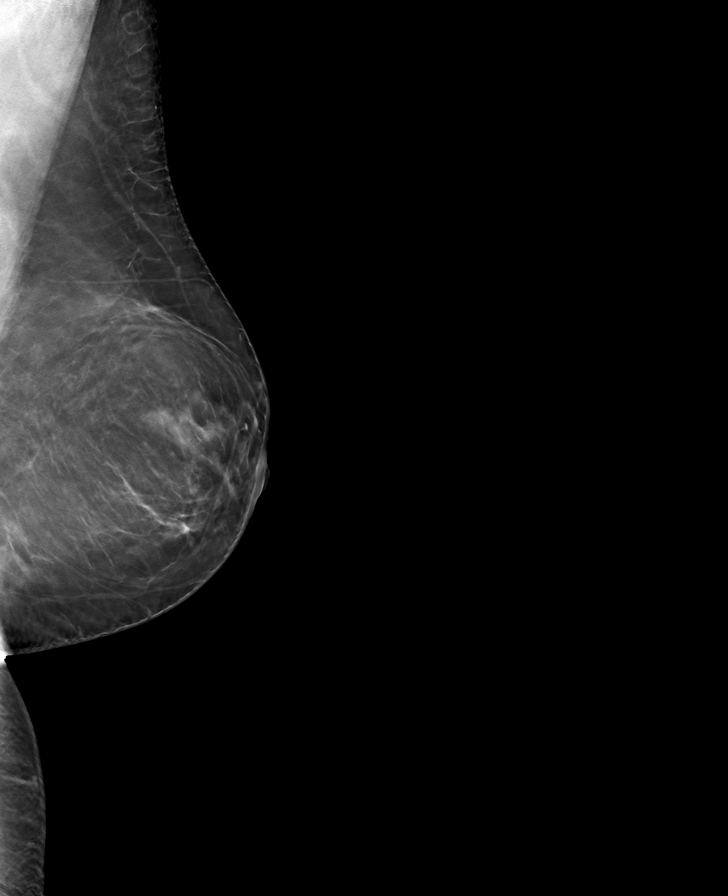

[R CC tomo · tomo slice 33/64.0]
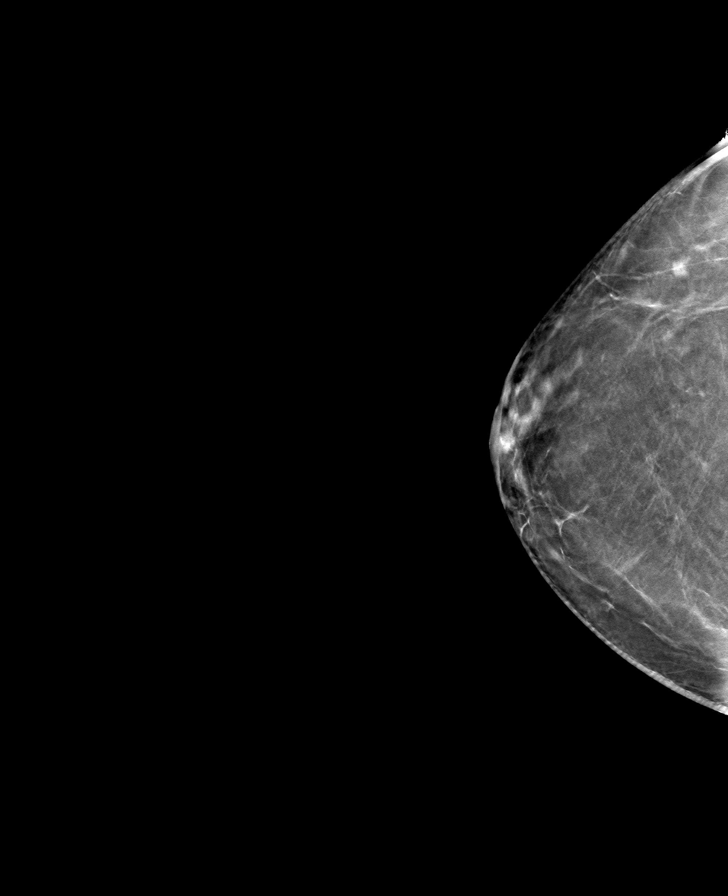

[8 of 24 positions shown; findings below may reference images not displayed]

ACR Breast Density Category b: There are scattered areas of
fibroglandular density.
FINDINGS: There are no findings suspicious for malignancy. Images were
processed with CAD.
IMPRESSION: No mammographic evidence of malignancy. A result letter of this
screening mammogram will be mailed directly to the patient.

RECOMMENDATION:
Screening mammogram in one year. (Code:[id number])

BI-RADS CATEGORY  1: Negative.

## 2021-08-29 ENCOUNTER — Other Ambulatory Visit: Payer: Self-pay | Admitting: Family Medicine

## 2021-08-30 ENCOUNTER — Ambulatory Visit (INDEPENDENT_AMBULATORY_CARE_PROVIDER_SITE_OTHER): Payer: Medicare Other | Admitting: Family Medicine

## 2021-08-30 ENCOUNTER — Other Ambulatory Visit: Payer: Self-pay

## 2021-08-30 VITALS — BP 109/74 | HR 75 | Temp 98.6°F | Resp 18 | Wt 166.8 lb

## 2021-08-30 DIAGNOSIS — Z1159 Encounter for screening for other viral diseases: Secondary | ICD-10-CM | POA: Diagnosis not present

## 2021-08-30 DIAGNOSIS — E785 Hyperlipidemia, unspecified: Secondary | ICD-10-CM

## 2021-08-30 DIAGNOSIS — R7303 Prediabetes: Secondary | ICD-10-CM

## 2021-08-30 LAB — POCT GLYCOSYLATED HEMOGLOBIN (HGB A1C): HbA1c, POC (prediabetic range): 6.6 % — AB (ref 5.7–6.4)

## 2021-08-30 NOTE — Patient Instructions (Signed)
  It was great seeing you today!  Please check-out at the front desk before leaving the clinic. I'd like to see you back in 3 months but if you need to be seen earlier than that for any new issues we're happy to fit you in, just give [place] a call!  Visit Remembers: - Continue to work on your healthy eating habits and incorporating exercise into your daily life. (see below) - Your goal is to have an A1c < 7 - Medicine Changes: None    Diet Recommendations for Diabetes  Carbohydrate includes starch, sugar, and fiber.  Of these, only sugar and starch raise blood glucose.  (Fiber is found in fruits, vegetables [especially skin, seeds, and stalks] and whole grains.)   Starchy (carb) foods: Bread, rice, pasta, potatoes, corn, cereal, grits, crackers, bagels, muffins, all baked goods.  (Fruit, milk, and yogurt also have carbohydrate, but most of these foods will not spike your blood sugar as most starchy foods will.)  A few fruits do cause high blood sugars; use small portions of bananas (limit to 1/2 at a time), grapes, watermelon, oranges, and most tropical fruits.   Protein foods: Meat, fish, poultry, eggs, dairy foods, and beans such as pinto and kidney beans (beans also provide carbohydrate).   1. Eat at least REAL 3 meals and 1-2 snacks per day. Never go more than 4-5 hours while awake without eating. Eat breakfast within the first hour of getting up.   2. Limit starchy foods to TWO per meal and ONE per snack. ONE portion of a starchy food is equal to the following:   - ONE slice of bread (or its equivalent, such as half of a hamburger bun).   - 1/2 cup of a "scoopable" starchy food such as potatoes or rice.   - 15 grams of Total Carbohydrate as shown on food label.   - Every 4 ounces of a sweet drink (including fruit juice). 3. Include at every meal: a protein food, a carb food, and vegetables and/or fruit.   - Obtain twice the volume of veg's as protein or carbohydrate foods for both lunch and  dinner.   - Fresh or frozen veg's are best.   - Keep frozen veg's on hand for a quick vegetable serving.      Regarding lab work today:  Due to recent changes in healthcare laws, you may see the results of your imaging and laboratory studies on MyChart before your doctor has had a chance to review them.  We understand that in some cases there may be results that are confusing or concerning to you. Not all laboratory results come back in the same time frame and your doctor may be waiting for multiple results in order to interpret others.  Please give [place] 72 hours in order for your doctor to thoroughly review all the results before contacting the office for clarification of your results. If everything is normal, you will get a letter in the mail or a message in My Chart. Please give [place] a call if you do not hear from [place] after 2 weeks.  Please bring all of your medications with you to each visit.   Feel free to call with any questions or concerns at any time, at [phone].   Take care,  Dr. Rushie [facility]

## 2021-08-30 NOTE — Progress Notes (Signed)
   SUBJECTIVE:   CHIEF COMPLAINT / HPI:   Cindy [name] is a 45 y.o. female here for follow up of her chronic medical conditions.    Prediabetes Trying to watch what she eats. Caregiver states they have been struggling to keep her CBGs under control.  Fasting blood sugars 110-130.  Afternoon blood sugars 200-210.  Patient has been eating more sandwiches and pastries then previous.  She is a Sales promotion account executive.  She likes to swim, play tennis, and bocce ball.   HLD Takes simvastatin. Denies chest pain, shortness of breath or myalgias.     PERTINENT  PMH / PSH: reviewed and updated as appropriate   OBJECTIVE:   BP 109/74 (BP Location: Left Arm, Patient Position: Sitting)   Pulse 75   Temp 98.6 F (37 C)   Resp 18   Wt 166 lb 12.8 oz (75.7 kg)   LMP  (LMP Unknown) Comment: skips placebo pills  SpO2 99%   BMI 30.51 kg/m    GEN: well appearing female, in no acute distress  CV: regular rate and rhythm  RESP: no increased work of breathing, clear to ascultation bilaterally ABD: Soft, non-tender, non-distended. SKIN: warm, dry, no rash on visible skin   ASSESSMENT/PLAN:   HLD (hyperlipidemia) Stable. Taking Simvastatin. Repeat LDL today.   Prediabetes Overall weight stable despite caregiver concern for weight loss. A1c crossing diabetes barrier today at 6.6 which has been up-trending for the past year. Collaborative counseling on the effects of diabetes on the body with pt and her caregiver who shared her bouts with diabetes. Encouraged diet rich in vegetables and complex carbs. Limit daily combination of sandwiches, pastries and rice/pasta. Pt is willing to try. Counseled on need to increase exercising and she is planning to swim this summer.  - Follow up in 3 months  -BMP today   Healthcare Maintenance  Obtain Hep C today.  Vaccines:  COVID, pneumococcal discuss at follow up   Lyndee Hensen, DO PGY-3, [facility] 09/02/2021

## 2021-08-31 LAB — BASIC METABOLIC PANEL
BUN/Creatinine Ratio: 13 (ref 9–23)
BUN: 12 mg/dL (ref 6–24)
CO2: 22 mmol/L (ref 20–29)
Calcium: 9.4 mg/dL (ref 8.7–10.2)
Chloride: 101 mmol/L (ref 96–106)
Creatinine, Ser: 0.92 mg/dL (ref 0.57–1.00)
Glucose: 126 mg/dL — ABNORMAL HIGH (ref 70–99)
Potassium: 4.5 mmol/L (ref 3.5–5.2)
Sodium: 140 mmol/L (ref 134–144)
eGFR: 78 mL/min/{1.73_m2} (ref >59)

## 2021-08-31 LAB — HCV INTERPRETATION

## 2021-08-31 LAB — HCV AB W REFLEX TO QUANT PCR: HCV Ab: <0.1 s/co ratio (ref 0.0–0.9)

## 2021-08-31 LAB — LDL CHOLESTEROL, DIRECT: LDL Direct: 77 mg/dL (ref 0–99)

## 2021-09-02 NOTE — Assessment & Plan Note (Addendum)
Stable. Taking Simvastatin. Repeat LDL today.

## 2021-09-02 NOTE — Assessment & Plan Note (Signed)
Overall weight stable despite caregiver concern for weight loss. A1c crossing diabetes barrier today at 6.6 which has been up-trending for the past year. Collaborative counseling on the effects of diabetes on the body with pt and her caregiver who shared her bouts with diabetes. Encouraged diet rich in vegetables and complex carbs. Limit daily combination of sandwiches, pastries and rice/pasta. Pt is willing to try. Counseled on need to increase exercising and she is planning to swim this summer.  - Follow up in 3 months

## 2021-09-04 ENCOUNTER — Other Ambulatory Visit: Payer: Self-pay

## 2021-09-04 ENCOUNTER — Ambulatory Visit (INDEPENDENT_AMBULATORY_CARE_PROVIDER_SITE_OTHER): Payer: Medicare Other

## 2021-09-04 DIAGNOSIS — Z23 Encounter for immunization: Secondary | ICD-10-CM

## 2021-10-01 ENCOUNTER — Other Ambulatory Visit: Payer: Self-pay | Admitting: Family Medicine

## 2021-11-05 ENCOUNTER — Other Ambulatory Visit: Payer: Self-pay | Admitting: Family Medicine

## 2021-11-13 ENCOUNTER — Other Ambulatory Visit: Payer: Self-pay | Admitting: Family Medicine

## 2021-12-12 ENCOUNTER — Other Ambulatory Visit: Payer: Self-pay | Admitting: Family Medicine

## 2022-01-17 ENCOUNTER — Other Ambulatory Visit: Payer: Self-pay | Admitting: Family Medicine

## 2022-02-26 ENCOUNTER — Encounter: Payer: Self-pay | Admitting: *Deleted

## 2022-04-16 ENCOUNTER — Ambulatory Visit: Payer: Medicare Other | Admitting: Dermatology

## 2022-04-26 ENCOUNTER — Ambulatory Visit: Payer: Medicare Other | Admitting: Family Medicine

## 2022-05-08 ENCOUNTER — Other Ambulatory Visit: Payer: Self-pay | Admitting: Family Medicine

## 2022-05-20 ENCOUNTER — Other Ambulatory Visit: Payer: Self-pay | Admitting: Family Medicine

## 2022-05-29 ENCOUNTER — Other Ambulatory Visit: Payer: Self-pay | Admitting: Family Medicine

## 2022-05-29 NOTE — Telephone Encounter (Signed)
Patient is needing refills on:  Vitamin D3 Calcium  Multi Vitamin   Sent to pharmacy on file:  [facility] in [place].

## 2022-05-30 ENCOUNTER — Other Ambulatory Visit: Payer: Self-pay

## 2022-05-30 MED ORDER — SENTRY PO TABS: 1.0000 | ORAL_TABLET | Freq: Every day | ORAL | 3 refills | Status: DC

## 2022-05-30 MED ORDER — VITAMIN D3 25 MCG (1000 UNIT) PO TABS: 2000.0000 [IU] | ORAL_TABLET | Freq: Every day | ORAL | 3 refills | Status: DC

## 2022-05-30 MED ORDER — OYSTER SHELL CALCIUM 500 MG PO TABS: 500.0000 mg | ORAL_TABLET | Freq: Two times a day (BID) | ORAL | 3 refills | Status: DC

## 2022-07-19 ENCOUNTER — Other Ambulatory Visit: Payer: Self-pay | Admitting: Family Medicine

## 2022-07-19 DIAGNOSIS — Z1231 Encounter for screening mammogram for malignant neoplasm of breast: Secondary | ICD-10-CM

## 2022-08-02 ENCOUNTER — Other Ambulatory Visit: Payer: Self-pay | Admitting: Family Medicine

## 2022-08-19 ENCOUNTER — Other Ambulatory Visit: Payer: Self-pay | Admitting: Family Medicine

## 2022-09-04 ENCOUNTER — Other Ambulatory Visit: Payer: Self-pay | Admitting: Family Medicine

## 2022-09-11 ENCOUNTER — Other Ambulatory Visit: Payer: Self-pay

## 2022-09-11 MED ORDER — VITAMIN D3 25 MCG (1000 UNIT) PO TABS: 2000.0000 [IU] | ORAL_TABLET | Freq: Every day | ORAL | 3 refills | Status: DC

## 2022-09-11 MED ORDER — SENTRY PO TABS: 1.0000 | ORAL_TABLET | Freq: Every day | ORAL | 3 refills | Status: AC

## 2022-09-13 ENCOUNTER — Ambulatory Visit: Payer: Medicare Other

## 2022-11-14 NOTE — Patient Instructions (Signed)

## 2022-11-14 NOTE — Progress Notes (Signed)
I connected with  Cindy [name] on 11/15/2022 by a audio enabled telemedicine application and verified that I am speaking with the correct person using two identifiers.  Patient Location: Home  Provider Location: Office/Clinic  I discussed the limitations of evaluation and management by telemedicine. The patient expressed understanding and agreed to proceed.  Subjective:   Cindy [name] is a 47 y.o. female who presents for Medicare Annual (Subsequent) preventive examination.  Review of Systems    Per HPI unless specifically indicated below.  Cardiac Risk Factors include: advanced age (>74mn, >>96women);female gender        Objective:        08/30/2021    9:06 AM 06/25/2021   10:59 AM 01/24/2021   10:45 AM  Vitals with BMI  Height  '5\' 2"'$  '5\' 2"'$   Weight 166 lbs 13 oz 175 lbs 13 oz 165 lbs 3 oz  BMI  [id number] Systolic [id number] Diastolic 74 87 74  Pulse 75 80 83    There were no vitals filed for this visit. There is no height or weight on file to calculate BMI.     11/15/2022    8:20 AM 06/25/2021   11:00 AM 06/27/2020    2:21 PM 03/25/2019    1:48 PM 07/31/2018    9:50 AM 03/17/2018    3:47 PM 03/14/2017   11:08 AM  Advanced Directives  Does Patient Have a Medical Advance Directive? No No No No No No No  Would patient like information on creating a medical advance directive? No - Patient declined No - Patient declined No - Patient declined No - Patient declined No - Patient declined No - Patient declined No - Patient declined    Current Medications (verified) Outpatient Encounter Medications as of 11/15/2022  Medication Sig   Blood Glucose Monitoring Suppl (FORA V30A BLOOD GLUCOSE SYSTEM) w/Device KIT by Does not apply route.   cetirizine (ZYRTEC) 10 MG tablet TAKE (1) TABLET BY MOUTH ONCE DAILY.   cholecalciferol (VITAMIN D3) 25 MCG (1000 UNIT) tablet Take 2 tablets (2,000 Units total) by mouth daily.   escitalopram (LEXAPRO) 20 MG tablet Take 30 mg by  mouth daily.   FORA Lancets MISC USE TO TEST BLOOD SUGAR ONCE DAILY.   FORA V30A BLOOD GLUCOSE TEST test strip USE TO TEST BLOOD SUGAR ONCE DAILY.   ibuprofen (ADVIL) 400 MG tablet TAKE 1 TABLET (='400MG'$ ) BY MOUTH EVERY 6 HOURS AS NEEDED FOR MILD PAIN.   levETIRAcetam (KEPPRA) 500 MG tablet Take 1,250 mg by mouth 2 (two) times daily.   Magnesium 250 MG TABS TAKE 1 TABLET BY MOUTH EVERY DAY   Magnesium Oxide -Mg Supplement 250 MG TABS TAKE (1) TABLET BY MOUTH ONCE DAILY.   Multiple Vitamins-Minerals (SENTRY) TABS Take 1 tablet by mouth daily.   norgestimate-ethinyl estradiol (ORTHO-CYCLEN) 0.25-35 MG-MCG tablet TAKE 1 TABLET BY MOUTH ONCE DAILY (SKIPPING PLACEBO PILLS)   Oyster Shell Calcium 500 MG TABS Take 0.4 tablets (500 mg total) by mouth 2 (two) times daily with a meal.   simvastatin (ZOCOR) 40 MG tablet TAKE 1 TABLET BY MOUTH AT BEDTIME.   [DISCONTINUED] hydrOXYzine (ATARAX/VISTARIL) 25 MG tablet Take 1 tablet (25 mg total) by mouth at bedtime as needed for itching. (Patient not taking: Reported on 11/15/2022)   [DISCONTINUED] triamcinolone cream (KENALOG) 0.1 % Apply to back qd to as needed (Patient not taking: Reported on 11/15/2022)   No facility-administered encounter medications on file as of  11/15/2022.    Allergies (verified) Patient has no known allergies.   History: Past Medical History:  Diagnosis Date   Atypical mole 09/06/2014   atypiucal nevus- left cheek-wider shave   Seizures ([place])    Past Surgical History:  Procedure Laterality Date   cyber onyx implant  03/2016   help control sizures    Family History  Problem Relation Age of Onset   High Cholesterol Mother    High Cholesterol Father    Other Father        Pacemaker    Breast cancer Paternal Grandmother    Social History   Socioeconomic History   Marital status: Single    Spouse name: Not on file   Number of children: Not on file   Years of education: Not on file   Highest education level: Not on file   Occupational History   Occupation: disable  Tobacco Use   Smoking status: Never   Smokeless tobacco: Never  Vaping Use   Vaping Use: Never used  Substance and Sexual Activity   Alcohol use: No   Drug use: No   Sexual activity: Never    Birth control/protection: Pill  Other Topics Concern   Not on file  Social History Narrative   [facility] age [age].    Never smoker, no alcohol or other drugs   Never been sexually active   Social Determinants of Health   Financial Resource Strain: Low Risk  (11/15/2022)   Overall Financial Resource Strain (CARDIA)    Difficulty of Paying Living Expenses: Not hard at all  Food Insecurity: No Food Insecurity (11/15/2022)   Hunger Vital Sign    Worried About Running Out of Food in the Last Year: Never true    [place] in the Last Year: Never true  Transportation Needs: No Transportation Needs (11/15/2022)   PRAPARE - Hydrologist (Medical): No    Lack of Transportation (Non-Medical): No  Physical Activity: Sufficiently Active (11/15/2022)   Exercise Vital Sign    Days of Exercise per Week: 3 days    Minutes of Exercise per Session: 60 min  Stress: Stress Concern Present (11/15/2022)   [place]    Feeling of Stress : Very much  Social Connections: Not on file    Tobacco Counseling Counseling given: Not Answered   Clinical Intake:  Pre-visit preparation completed: No  Pain : No/denies pain     Nutritional Status: BMI of 19-24  Normal Nutritional Risks: None Diabetes: Yes CBG done?: Yes CBG resulted in Enter/ Edit results?: Yes Did pt. bring in CBG monitor from home?: No  How often do you need to have someone help you when you read instructions, pamphlets, or other written materials from your doctor or pharmacy?: 4 - Often  Diabetic?No  Interpreter Needed?: No  Information entered by :: Donnie Mesa,  CMA   Activities of Daily Living    11/15/2022    8:10 AM  In your present state of health, do you have any difficulty performing the following activities:  Hearing? 0  Vision? 0  Difficulty concentrating or making decisions? 1  Walking or climbing stairs? 0  Dressing or bathing? 0  Doing errands, shopping? 0    Patient Care Team: Rise Patience, DO as PCP - General (Family Medicine)  Indicate any recent Medical Services you may have received from other than [facility] providers in the past year (  date may be approximate).     Assessment:   This is a routine wellness examination for [place].  Hearing/Vision screen Denies any hearing issues. Denies any vision changes. Overdue for an Annual Eye Exam.  Dietary issues and exercise activities discussed: Current Exercise Habits: Structured exercise class, Time (Minutes): 60, Frequency (Times/Week): 3, Weekly Exercise (Minutes/Week): 180, Intensity: Moderate, Exercise limited by: None identified   Goals Addressed   None    Depression Screen    11/15/2022    8:09 AM 06/25/2021   11:00 AM 06/27/2020    2:21 PM 03/21/2020    9:57 AM 09/10/2019    9:13 AM 03/25/2019    1:48 PM 07/31/2018    9:50 AM  PHQ 2/9 Scores  PHQ - 2 Score 1 0 0 0 0 0 0  PHQ- 9 Score  0 0        Fall Risk    11/15/2022    8:10 AM 03/21/2020    9:56 AM  Fall Risk   Falls in the past year? 0 0  Number falls in past yr: 0 0  Injury with Fall? 0 0  Risk for fall due to : No Fall Risks   Follow up Falls evaluation completed     [place]:  Any stairs in or around the home? Yes  If so, are there any without handrails? No  Home free of loose throw rugs in walkways, pet beds, electrical cords, etc? Yes  Adequate lighting in your home to reduce risk of falls? Yes   ASSISTIVE DEVICES UTILIZED TO PREVENT FALLS:  Life alert? No  Use of a cane, walker or w/c? No  Grab bars in the bathroom? No  Shower chair or bench in  shower? No  Elevated toilet seat or a handicapped toilet? No   TIMED UP AND GO:  Was the test performed?Unable to perform, virtual appointment   Cognitive Function:        11/15/2022    8:11 AM  6CIT Screen  What Year? 0 points  What month? 0 points  What time? 0 points  Count back from 20 0 points  Months in reverse 0 points  Repeat phrase 10 points  Total Score 10 points    Immunizations Immunization History  Administered Date(s) Administered   Influenza,inj,Quad PF,6+ Mos 06/11/2017, 06/19/2018, 06/07/2019, 06/25/2021   Influenza-Unspecified 05/24/2016, 06/25/2021, 07/03/2022   PFIZER(Purple Top)SARS-COV-2 Vaccination 10/06/2019, 11/01/2019, 06/27/2020, 09/04/2021, 07/03/2022   Pfizer Covid-19 Vaccine Bivalent Booster 62yr & up 09/04/2021    TDAP status: Due, Education has been provided regarding the importance of this vaccine. Advised may receive this vaccine at local pharmacy or Health Dept. Aware to provide a copy of the vaccination record if obtained from local pharmacy or Health Dept. Verbalized acceptance and understanding.  Flu Vaccine status: Due, Education has been provided regarding the importance of this vaccine. Advised may receive this vaccine at local pharmacy or Health Dept. Aware to provide a copy of the vaccination record if obtained from local pharmacy or Health Dept. Verbalized acceptance and understanding.  Pneumococcal vaccine: not applicable  C0000000vaccine status: Information provided on how to obtain vaccines.   Qualifies for Shingles Vaccine? No   Screening Tests Health Maintenance  Topic Date Due   DTaP/Tdap/Td (1 - Tdap) Never done   PAP SMEAR-Modifier  04/09/2020   MAMMOGRAM  08/15/2020   COVID-19 Vaccine (6 - 2023-24 season) 08/28/2022   COLONOSCOPY (Pts 45-436yrInsurance coverage will need to be  confirmed)  11/16/2023 (Originally 12/25/2020)   Medicare Annual Wellness (AWV)  11/16/2023   INFLUENZA VACCINE  Completed   Hepatitis C  Screening  Completed   HIV Screening  Completed   HPV VACCINES  Aged Out    Health Maintenance  Health Maintenance Due  Topic Date Due   DTaP/Tdap/Td (1 - Tdap) Never done   PAP SMEAR-Modifier  04/09/2020   MAMMOGRAM  08/15/2020   COVID-19 Vaccine (6 - 2023-24 season) 08/28/2022     Colorectal Screening: Requested to have that done closer to 27 yrs of age.  Mammogram status: Ordered 07/19/2022. Pt provided with contact info and advised to call to schedule appt.   DEXA Scan: not applicable   Lung Cancer Screening: (Low Dose CT Chest recommended if Age 61-80 years, 30 pack-year currently smoking OR have quit w/in 15years.) does not qualify.   Lung Cancer Screening Referral: not applicable  Additional Screening:  Hepatitis C Screening: does qualify; Completed 08/30/2021  Vision Screening: Recommended annual ophthalmology exams for early detection of glaucoma and other disorders of the eye. Is the patient up to date with their annual eye exam?  No  Who is the provider or what is the name of the office in which the patient attends annual eye exams?  If pt is not established with a provider, would they like to be referred to a provider to establish care? No .   Dental Screening: Recommended annual dental exams for proper oral hygiene  Community Resource Referral / Chronic Care Management: CRR required this visit?  No   CCM required this visit?  No      Plan:     I have personally reviewed and noted the following in the patient's chart:   Medical and social history Use of alcohol, tobacco or illicit drugs  Current medications and supplements including opioid prescriptions. Patient is not currently taking opioid prescriptions. Functional ability and status Nutritional status Physical activity Advanced directives List of other physicians Hospitalizations, surgeries, and ER visits in previous 12 months Vitals Screenings to include cognitive, depression, and  falls Referrals and appointments  In addition, I have reviewed and discussed with patient certain preventive protocols, quality metrics, and best practice recommendations. A written personalized care plan for preventive services as well as general preventive health recommendations were provided to patient.    Ms. Bjornsen , Thank you for taking time to come for your Medicare Wellness Visit. I appreciate your ongoing commitment to your health goals. Please review the following plan we discussed and let me know if I can assist you in the future.   These are the goals we discussed:  Goals   None     This is a list of the screening recommended for you and due dates:  Health Maintenance  Topic Date Due   DTaP/Tdap/Td vaccine (1 - Tdap) Never done   Pap Smear  04/09/2020   Mammogram  08/15/2020   COVID-19 Vaccine (6 - 2023-24 season) 08/28/2022   Colon Cancer Screening  11/16/2023*   Medicare Annual Wellness Visit  11/16/2023   Flu Shot  Completed   Hepatitis C Screening: USPSTF Recommendation to screen - Ages 18-79 yo.  Completed   HIV Screening  Completed   HPV Vaccine  Aged Out  *Topic was postponed. The date shown is not the original due date.     Wilson Singer, [place]   11/15/2022 Nurse Notes: Approximately 30 minute Non-Face -To-Face Medicare Wellness Visit

## 2022-11-15 ENCOUNTER — Ambulatory Visit (INDEPENDENT_AMBULATORY_CARE_PROVIDER_SITE_OTHER): Payer: Medicare Other

## 2022-11-15 DIAGNOSIS — Z0000 Encounter for general adult medical examination without abnormal findings: Secondary | ICD-10-CM

## 2022-11-22 ENCOUNTER — Ambulatory Visit (INDEPENDENT_AMBULATORY_CARE_PROVIDER_SITE_OTHER): Payer: Medicare Other | Admitting: Family Medicine

## 2022-11-22 ENCOUNTER — Encounter: Payer: Self-pay | Admitting: Family Medicine

## 2022-11-22 VITALS — BP 130/76 | HR 85 | Ht 62.0 in | Wt 171.6 lb

## 2022-11-22 DIAGNOSIS — G40909 Epilepsy, unspecified, not intractable, without status epilepticus: Secondary | ICD-10-CM

## 2022-11-22 DIAGNOSIS — E119 Type 2 diabetes mellitus without complications: Secondary | ICD-10-CM | POA: Diagnosis present

## 2022-11-22 DIAGNOSIS — E785 Hyperlipidemia, unspecified: Secondary | ICD-10-CM | POA: Diagnosis not present

## 2022-11-22 LAB — POCT GLYCOSYLATED HEMOGLOBIN (HGB A1C): HbA1c, POC (prediabetic range): 6.7 % — AB (ref 5.7–6.4)

## 2022-11-22 MED ORDER — MAGNESIUM OXIDE -MG SUPPLEMENT 250 MG PO TABS: 1.0000 | ORAL_TABLET | Freq: Every day | ORAL | 3 refills | Status: AC

## 2022-11-22 MED ORDER — CETIRIZINE HCL 10 MG PO TABS: ORAL_TABLET | ORAL | 3 refills | Status: AC

## 2022-11-22 MED ORDER — NORGESTIMATE-ETH ESTRADIOL 0.25-35 MG-MCG PO TABS: ORAL_TABLET | ORAL | 11 refills | Status: DC

## 2022-11-22 MED ORDER — VITAMIN D3 25 MCG (1000 UNIT) PO TABS: 2000.0000 [IU] | ORAL_TABLET | Freq: Every day | ORAL | 3 refills | Status: AC

## 2022-11-22 MED ORDER — SIMVASTATIN 40 MG PO TABS: 40.0000 mg | ORAL_TABLET | Freq: Every day | ORAL | 3 refills | Status: DC

## 2022-11-22 MED ORDER — OYSTER SHELL CALCIUM 500 MG PO TABS: 500.0000 mg | ORAL_TABLET | Freq: Two times a day (BID) | ORAL | 3 refills | Status: AC

## 2022-11-22 NOTE — Progress Notes (Signed)
    SUBJECTIVE:   CHIEF COMPLAINT / HPI:   Bethe presents with her caretaker today for diet controlled diabetes check.  T2DM- A1c 6.7 today. Not on medications at home. Eats well, per her caretaker has to be reminded to watch what she eats. Denies nocturia, polyuria.   HLD- taking home statin, no muscle aches or pain. No side effects noted.  Epilepsy- follows with neurology, on home Keppra, no seizures for 9 years. Has implantable stimulator.  PERTINENT  PMH / PSH: diet controlled T2DM, HLD, intellectual disability, epilepsy  OBJECTIVE:   BP 130/76   Pulse 85   Ht '5\' 2"'$  (1.575 m)   Wt 171 lb 9.6 oz (77.8 kg)   SpO2 96%   BMI 31.39 kg/m   General: A&O, NAD HEENT: No sign of trauma, EOM grossly intact Cardiac: RRR, no m/r/g Respiratory: CTAB, normal WOB, no w/c/r GI: Soft, NTTP, non-distended  Extremities: NTTP, no peripheral edema. Neuro: Normal gait, moves all four extremities appropriately. Psych: Appropriate mood and affect   ASSESSMENT/PLAN:   Diet-controlled type 2 diabetes mellitus (HCC) A1c 6.7 today on no medications Continue diet control Discussed getting eye exam with caregiver Due to excellent control reasonable to get A1c every 6 months  HLD (hyperlipidemia) Lipid panel today, refilled statin, no side effects  Epilepsy ([place]) Well controlled, continue Keppra, follows with neurology   Reminded of due for repeat mammogram, caretaker plans to reschedule.  Lenoria Chime, MD [place]

## 2022-11-22 NOTE — Assessment & Plan Note (Signed)
A1c 6.7 today on no medications Continue diet control Discussed getting eye exam with caregiver Due to excellent control reasonable to get A1c every 6 months

## 2022-11-22 NOTE — Patient Instructions (Addendum)
It was wonderful to see you today.  Please bring ALL of your medications with you to every visit.   Today we talked about:  - We refilled your medication today - We will check your cholesterol and kidney function today   Don;t forget to reschedule your mammogram. And eye exam.  Thank you for choosing [facility].   Please call [phone] with any questions about today's appointment.  Please arrive at least 15 minutes prior to your scheduled appointments.   If you had blood work today, I will send you a MyChart message or a letter if results are normal. Otherwise, I will give you a call.   If you had a referral placed, they will call you to set up an appointment. Please give [place] a call if you don't hear back in the next 2 weeks.   If you need additional refills before your next appointment, please call your pharmacy first.   Yehuda Savannah, MD  Family Medicine

## 2022-11-22 NOTE — Assessment & Plan Note (Signed)
Well controlled, continue Keppra, follows with neurology

## 2022-11-22 NOTE — Assessment & Plan Note (Signed)
Lipid panel today, refilled statin, no side effects

## 2022-11-23 LAB — BASIC METABOLIC PANEL
BUN/Creatinine Ratio: 14 (ref 9–23)
BUN: 11 mg/dL (ref 6–24)
CO2: 23 mmol/L (ref 20–29)
Calcium: 9.8 mg/dL (ref 8.7–10.2)
Chloride: 102 mmol/L (ref 96–106)
Creatinine, Ser: 0.77 mg/dL (ref 0.57–1.00)
Glucose: 134 mg/dL — ABNORMAL HIGH (ref 70–99)
Potassium: 5 mmol/L (ref 3.5–5.2)
Sodium: 138 mmol/L (ref 134–144)
eGFR: 96 mL/min/{1.73_m2} (ref >59)

## 2022-11-23 LAB — LIPID PANEL
Chol/HDL Ratio: 2.8 ratio (ref 0.0–4.4)
Cholesterol, Total: 168 mg/dL (ref 100–199)
HDL: 61 mg/dL (ref >39)
LDL Chol Calc (NIH): 80 mg/dL (ref 0–99)
Triglycerides: 156 mg/dL — ABNORMAL HIGH (ref 0–149)
VLDL Cholesterol Cal: 27 mg/dL (ref 5–40)

## 2023-03-16 ENCOUNTER — Encounter: Payer: Self-pay | Admitting: Family Medicine

## 2023-03-17 ENCOUNTER — Other Ambulatory Visit: Payer: Self-pay | Admitting: Family Medicine

## 2023-03-17 MED ORDER — ACCU-CHEK GUIDE ME W/DEVICE KIT: 1.0000 | PACK | Freq: Every day | 0 refills | Status: DC

## 2023-03-19 ENCOUNTER — Other Ambulatory Visit: Payer: Self-pay

## 2023-03-19 MED ORDER — FORA LANCETS MISC: 11 refills | Status: DC

## 2023-03-19 MED ORDER — FORA V30A BLOOD GLUCOSE TEST VI STRP: ORAL_STRIP | 2 refills | Status: DC

## 2023-03-19 MED ORDER — ACCU-CHEK GUIDE ME W/DEVICE KIT: PACK | 0 refills | Status: DC

## 2023-03-22 NOTE — Progress Notes (Deleted)
  SUBJECTIVE:   CHIEF COMPLAINT / HPI:   ***  PERTINENT  PMH / PSH: T2DM, HLD  Past Medical History:  Diagnosis Date   Atypical mole 09/06/2014   atypiucal nevus- left cheek-wider shave   Seizures (HCC)     Patient Care Team: Evelena Leyden, DO as PCP - General (Family Medicine) OBJECTIVE:  There were no vitals taken for this visit. Physical Exam   ASSESSMENT/PLAN:  There are no diagnoses linked to this encounter. No follow-ups on file. Shelby Mattocks, DO 03/22/2023, 12:44 PM PGY-***, [facility] {    This will disappear when note is signed, click to select method of visit    :1}

## 2023-03-24 ENCOUNTER — Ambulatory Visit: Payer: Medicare Other | Admitting: Student

## 2023-06-23 ENCOUNTER — Encounter: Payer: Self-pay | Admitting: Family Medicine

## 2023-07-08 ENCOUNTER — Telehealth: Payer: Self-pay

## 2023-07-08 ENCOUNTER — Encounter: Payer: Self-pay | Admitting: Family Medicine

## 2023-07-08 ENCOUNTER — Ambulatory Visit: Payer: Medicare Other | Admitting: Family Medicine

## 2023-07-08 VITALS — BP 148/85 | HR 80 | Ht 62.0 in | Wt 179.2 lb

## 2023-07-08 DIAGNOSIS — E119 Type 2 diabetes mellitus without complications: Secondary | ICD-10-CM | POA: Diagnosis not present

## 2023-07-08 DIAGNOSIS — Z7985 Long-term (current) use of injectable non-insulin antidiabetic drugs: Secondary | ICD-10-CM

## 2023-07-08 DIAGNOSIS — Z23 Encounter for immunization: Secondary | ICD-10-CM | POA: Diagnosis not present

## 2023-07-08 DIAGNOSIS — R030 Elevated blood-pressure reading, without diagnosis of hypertension: Secondary | ICD-10-CM | POA: Diagnosis not present

## 2023-07-08 LAB — POCT GLYCOSYLATED HEMOGLOBIN (HGB A1C): HbA1c, POC (controlled diabetic range): 7.5 % — AB (ref 0.0–7.0)

## 2023-07-08 MED ORDER — ACCU-CHEK GUIDE VI STRP: ORAL_STRIP | 12 refills | Status: DC

## 2023-07-08 MED ORDER — SEMAGLUTIDE(0.25 OR 0.5MG/DOS) 2 MG/1.5ML ~~LOC~~ SOPN: 0.2500 mg | PEN_INJECTOR | SUBCUTANEOUS | Status: DC

## 2023-07-08 MED ORDER — SEMAGLUTIDE-WEIGHT MANAGEMENT 0.25 MG/0.5ML ~~LOC~~ SOAJ
0.2500 mg | SUBCUTANEOUS | 0 refills | Status: DC
Start: 2023-07-08 — End: 2023-08-06

## 2023-07-08 NOTE — Telephone Encounter (Signed)
Sent in requested accu-chek test strips to pharmacy.

## 2023-07-08 NOTE — Patient Instructions (Signed)
We started ozempic in the office today. I have sent in the prescription. Let me know how your side effects are, particularly nausea and constipation. If you do well on 0.25 mg weekly for 4 weeks, you can go up to 0.5 mg weekly. Let me know when you get to this point. We will recheck your A1c in 3 months.  You also received your flu shot today!

## 2023-07-08 NOTE — Progress Notes (Signed)
Asked by Dr. Phineas Real to provide education on use of Ozempic (semaglutide) 0.25mg  once weekly.  Patient educated on purpose, proper use and potential adverse effects of nausea.   Following instruction patient verbalized understanding of treatment plan.  Patient was able to demonstrate appropriate technique while administering first dose in office. Patient seen with Cindy [name], PharmD Candidate.

## 2023-07-08 NOTE — Assessment & Plan Note (Signed)
A1c 7.5 today.  Discussed semaglutide given elevated A1c and BMI.  Patient amenable.  Appreciate Dr. Raymondo Band and pharmacy team teaching patient how to use and providing sample in office.  Prescription sent.  Follow-up A1c in 3 months.

## 2023-07-08 NOTE — Progress Notes (Signed)
    SUBJECTIVE:   CHIEF COMPLAINT / HPI:   A1c and weight check A1c today elevated to 7.5 from 6.7. Fasting sugars have been low per caregiver. Would like to get some medications to help since exercise and diet have not been working. Dr. August Saucer, her neurologist, does not want her on metformin.  PERTINENT  PMH / PSH: Previously diet-controlled type 2 diabetes, epilepsy, intellectual disability.    She is an avid fan of [employer]CAR, specifically Cindy [name], who shares her same initials.  She is also a Event organiser with swimming and tennis!  OBJECTIVE:   BP (!) 148/85   Pulse 80   Ht 5\' 2"  (1.575 m)   Wt 179 lb 3.2 oz (81.3 kg)   SpO2 99%   BMI 32.78 kg/m   General: Alert and oriented, in NAD Skin: Warm, dry, and intact without lesions HEENT: NCAT, EOM grossly normal, midline nasal septum Cardiac: Regular rate Respiratory: Breathing and speaking comfortably on RA Extremities: Moves all extremities grossly equally Neurological: No gross focal deficit Psychiatric: Appropriate mood and affect   ASSESSMENT/PLAN:   Type 2 diabetes mellitus without complications (HCC) A1c 7.5 today.  Discussed semaglutide given elevated A1c and BMI.  Patient amenable.  Appreciate Dr. Raymondo Band and pharmacy team teaching patient how to use and providing sample in office.  Prescription sent.  Follow-up A1c in 3 months.  Elevated blood pressure reading Elevated today.  Better controlled in previous office visits.  Recheck at next visit and consider starting antihypertensive as indicated.   Health maintenance Flu shot obtained without incident.  Cindy Holmes, MD [facility] C[name] [facility]

## 2023-07-08 NOTE — Assessment & Plan Note (Signed)
Elevated today.  Better controlled in previous office visits.  Recheck at next visit and consider starting antihypertensive as indicated.

## 2023-07-30 ENCOUNTER — Other Ambulatory Visit (HOSPITAL_BASED_OUTPATIENT_CLINIC_OR_DEPARTMENT_OTHER): Payer: Self-pay

## 2023-07-30 ENCOUNTER — Encounter: Payer: Self-pay | Admitting: Family Medicine

## 2023-07-30 MED ORDER — SEMAGLUTIDE-WEIGHT MANAGEMENT 0.25 MG/0.5ML ~~LOC~~ SOAJ
0.2500 mg | SUBCUTANEOUS | 0 refills | Status: DC
Start: 2023-07-08 — End: 2023-12-01
  Filled 2023-07-30 – 2023-09-23 (×6): qty 2, 28d supply, fill #0

## 2023-08-04 ENCOUNTER — Other Ambulatory Visit (HOSPITAL_COMMUNITY): Payer: Self-pay

## 2023-08-04 ENCOUNTER — Other Ambulatory Visit: Payer: Self-pay | Admitting: Family Medicine

## 2023-08-04 ENCOUNTER — Other Ambulatory Visit (HOSPITAL_BASED_OUTPATIENT_CLINIC_OR_DEPARTMENT_OTHER): Payer: Self-pay

## 2023-08-05 ENCOUNTER — Other Ambulatory Visit (HOSPITAL_BASED_OUTPATIENT_CLINIC_OR_DEPARTMENT_OTHER): Payer: Self-pay

## 2023-08-05 ENCOUNTER — Other Ambulatory Visit: Payer: Self-pay

## 2023-08-06 ENCOUNTER — Other Ambulatory Visit (HOSPITAL_BASED_OUTPATIENT_CLINIC_OR_DEPARTMENT_OTHER): Payer: Self-pay

## 2023-08-07 ENCOUNTER — Other Ambulatory Visit (HOSPITAL_BASED_OUTPATIENT_CLINIC_OR_DEPARTMENT_OTHER): Payer: Self-pay

## 2023-08-11 ENCOUNTER — Other Ambulatory Visit (HOSPITAL_COMMUNITY): Payer: Self-pay

## 2023-08-12 ENCOUNTER — Telehealth: Payer: Self-pay

## 2023-08-12 ENCOUNTER — Other Ambulatory Visit (HOSPITAL_COMMUNITY): Payer: Self-pay

## 2023-08-12 NOTE — Telephone Encounter (Signed)
Pharmacy Patient Advocate Encounter   Received notification from Patient Advice Request messages that prior authorization for [facility] is required/requested.   Insurance verification completed.   The patient is insured through [facility] MEDICAID .   PA required; PA submitted to above mentioned insurance via [employer] Key/confirmation #/EOC [id number] W. Status is pending

## 2023-08-13 ENCOUNTER — Other Ambulatory Visit: Payer: Self-pay | Admitting: Family Medicine

## 2023-08-13 ENCOUNTER — Other Ambulatory Visit: Payer: Self-pay

## 2023-08-13 MED ORDER — NORGESTIMATE-ETH ESTRADIOL 0.25-35 MG-MCG PO TABS: ORAL_TABLET | ORAL | 11 refills | Status: DC

## 2023-08-13 NOTE — Telephone Encounter (Signed)
Patients caregiver calls nurse line requesting a refill on her birth control.   Advised a one year supply was sent to [facility] in March.   She reports they no longer use that pharmacy and they are not able to transfer prescriptions.   Please send in to [facility] in [facility].

## 2023-08-15 NOTE — Telephone Encounter (Signed)
Pharmacy Patient Advocate Encounter  Received notification from [facility] MEDICAID that Prior Authorization for [facility] has been  DENIED.   PA #/Case ID/Reference #: [id number] W

## 2023-08-20 ENCOUNTER — Telehealth: Payer: Self-pay

## 2023-08-20 ENCOUNTER — Other Ambulatory Visit (HOSPITAL_COMMUNITY): Payer: Self-pay

## 2023-08-20 MED ORDER — WEGOVY 0.25 MG/0.5ML ~~LOC~~ SOAJ: 0.2500 mg | SUBCUTANEOUS | 0 refills | Status: DC

## 2023-08-20 NOTE — Telephone Encounter (Addendum)
PA ENTERED IN ERROR

## 2023-08-20 NOTE — Telephone Encounter (Signed)
Spoke with patient's caregiver about semaglutide administration.  She has 1 more sample left to be given this coming Tuesday, 12/3.  She recounts a 4-year history of attempting a diabetic diet and increasing exercise to 2-3 times per week to help lose weight.  We also discussed again how Cindy [name]'s neurologist does not recommend metformin for diabetes.  She states there is no family history of thyroid cancer.  Because of patient's elevated BMI to 32.78 in addition to comorbidities of hypertension and type 2 diabetes, will prescribe Wegovy.

## 2023-08-29 ENCOUNTER — Other Ambulatory Visit: Payer: Self-pay

## 2023-09-02 ENCOUNTER — Other Ambulatory Visit (HOSPITAL_BASED_OUTPATIENT_CLINIC_OR_DEPARTMENT_OTHER): Payer: Self-pay

## 2023-09-02 NOTE — Telephone Encounter (Signed)
Received returned call from mother regarding status of PA on Wegovy.   Attempted to look up via Covermymeds. Received error message with key that was provided.   Will forward to [employer] for further assistance.   Veronda Prude, RN

## 2023-09-03 ENCOUNTER — Other Ambulatory Visit (HOSPITAL_COMMUNITY): Payer: Self-pay

## 2023-09-04 ENCOUNTER — Other Ambulatory Visit: Payer: Self-pay | Admitting: Family Medicine

## 2023-09-04 ENCOUNTER — Other Ambulatory Visit (HOSPITAL_COMMUNITY): Payer: Self-pay

## 2023-09-04 DIAGNOSIS — Z0000 Encounter for general adult medical examination without abnormal findings: Secondary | ICD-10-CM

## 2023-09-04 NOTE — Telephone Encounter (Signed)
Pharmacy Patient Advocate Encounter   Received notification from Physician's Office that prior authorization for [facility] is required/requested.   Insurance verification completed.   The patient is insured through  [facility] MEDICARE  .   Per test claim: PA required; PA submitted to above mentioned insurance via CoverMyMeds Key/confirmation #/EOC [id number]. Status is pending  (Previously had medicaid only. Previously denied Ozempic on medicaid, unknown reason. Have not tried PA for Ozempic under this insurance).

## 2023-09-08 NOTE — Telephone Encounter (Signed)
Pharmacy Patient Advocate Encounter  Received notification from  [facility] MEDICARE  that Prior Authorization for [facility] has been DENIED.  Full denial letter will be uploaded to the media tab. See denial reason below.

## 2023-09-10 ENCOUNTER — Telehealth: Payer: Self-pay | Admitting: Family Medicine

## 2023-09-10 NOTE — Telephone Encounter (Signed)
Spoke with patient's caregiver about denial of Wegovy.  Appears this is not covered under Medicare part D.  Caregiver gave me the below information about patient's neurologist who has told patient she does not recommend starting metformin:  [phone] Dr. Arville Go [facility] and spoke with Dr. August Saucer to get more information about contraindications to metformin for this patient and attempt to find the best treatment for elevated A1c.  No serious drug-drug interactions appreciated.  We discussed starting metformin XR 500 mg daily to help with reducing A1c.  Can also discuss coming off of Lexapro in the future given effects of weight gain.  Very much appreciate her kind recommendations in the care of Ms. Selman.  Patient's caregiver called back and informed of the proposed plan.  She is in agreement, though she will speak with Leighan and her mother once they are back from vacation at the end of this month to discuss.  She will see me a MyChart message confirming that is okay; I will send in the metformin and get her scheduled for a 1 month follow-up at that time.

## 2023-09-23 ENCOUNTER — Other Ambulatory Visit (HOSPITAL_BASED_OUTPATIENT_CLINIC_OR_DEPARTMENT_OTHER): Payer: Self-pay

## 2023-09-25 ENCOUNTER — Other Ambulatory Visit (HOSPITAL_BASED_OUTPATIENT_CLINIC_OR_DEPARTMENT_OTHER): Payer: Self-pay

## 2023-09-26 ENCOUNTER — Other Ambulatory Visit (HOSPITAL_BASED_OUTPATIENT_CLINIC_OR_DEPARTMENT_OTHER): Payer: Self-pay

## 2023-09-29 ENCOUNTER — Other Ambulatory Visit (HOSPITAL_BASED_OUTPATIENT_CLINIC_OR_DEPARTMENT_OTHER): Payer: Self-pay

## 2023-09-30 ENCOUNTER — Ambulatory Visit: Payer: Medicare Other

## 2023-09-30 ENCOUNTER — Other Ambulatory Visit (HOSPITAL_BASED_OUTPATIENT_CLINIC_OR_DEPARTMENT_OTHER): Payer: Self-pay

## 2023-10-02 ENCOUNTER — Other Ambulatory Visit (HOSPITAL_BASED_OUTPATIENT_CLINIC_OR_DEPARTMENT_OTHER): Payer: Self-pay

## 2023-10-03 ENCOUNTER — Other Ambulatory Visit (HOSPITAL_BASED_OUTPATIENT_CLINIC_OR_DEPARTMENT_OTHER): Payer: Self-pay

## 2023-10-06 ENCOUNTER — Other Ambulatory Visit (HOSPITAL_BASED_OUTPATIENT_CLINIC_OR_DEPARTMENT_OTHER): Payer: Self-pay

## 2023-10-07 ENCOUNTER — Other Ambulatory Visit (HOSPITAL_BASED_OUTPATIENT_CLINIC_OR_DEPARTMENT_OTHER): Payer: Self-pay

## 2023-10-08 ENCOUNTER — Other Ambulatory Visit (HOSPITAL_BASED_OUTPATIENT_CLINIC_OR_DEPARTMENT_OTHER): Payer: Self-pay

## 2023-10-09 ENCOUNTER — Ambulatory Visit: Payer: Medicare Other

## 2023-10-09 ENCOUNTER — Other Ambulatory Visit (HOSPITAL_BASED_OUTPATIENT_CLINIC_OR_DEPARTMENT_OTHER): Payer: Self-pay

## 2023-10-10 ENCOUNTER — Other Ambulatory Visit (HOSPITAL_BASED_OUTPATIENT_CLINIC_OR_DEPARTMENT_OTHER): Payer: Self-pay

## 2023-10-13 ENCOUNTER — Other Ambulatory Visit (HOSPITAL_BASED_OUTPATIENT_CLINIC_OR_DEPARTMENT_OTHER): Payer: Self-pay

## 2023-10-14 ENCOUNTER — Other Ambulatory Visit (HOSPITAL_BASED_OUTPATIENT_CLINIC_OR_DEPARTMENT_OTHER): Payer: Self-pay

## 2023-10-15 ENCOUNTER — Other Ambulatory Visit (HOSPITAL_BASED_OUTPATIENT_CLINIC_OR_DEPARTMENT_OTHER): Payer: Self-pay

## 2023-10-16 ENCOUNTER — Other Ambulatory Visit (HOSPITAL_BASED_OUTPATIENT_CLINIC_OR_DEPARTMENT_OTHER): Payer: Self-pay

## 2023-10-21 ENCOUNTER — Other Ambulatory Visit (HOSPITAL_BASED_OUTPATIENT_CLINIC_OR_DEPARTMENT_OTHER): Payer: Self-pay

## 2023-10-23 ENCOUNTER — Ambulatory Visit
Admission: RE | Admit: 2023-10-23 | Discharge: 2023-10-23 | Disposition: A | Discharge status: Home / Self Care | Payer: Medicare Other | Source: Ambulatory Visit | Attending: Family Medicine | Admitting: Family Medicine

## 2023-10-23 ENCOUNTER — Other Ambulatory Visit (HOSPITAL_BASED_OUTPATIENT_CLINIC_OR_DEPARTMENT_OTHER): Payer: Self-pay

## 2023-10-23 DIAGNOSIS — Z0000 Encounter for general adult medical examination without abnormal findings: Secondary | ICD-10-CM

## 2023-10-24 ENCOUNTER — Other Ambulatory Visit (HOSPITAL_BASED_OUTPATIENT_CLINIC_OR_DEPARTMENT_OTHER): Payer: Self-pay

## 2023-10-27 ENCOUNTER — Other Ambulatory Visit (HOSPITAL_BASED_OUTPATIENT_CLINIC_OR_DEPARTMENT_OTHER): Payer: Self-pay

## 2023-10-29 ENCOUNTER — Other Ambulatory Visit (HOSPITAL_BASED_OUTPATIENT_CLINIC_OR_DEPARTMENT_OTHER): Payer: Self-pay

## 2023-12-01 ENCOUNTER — Ambulatory Visit: Payer: Medicare Other

## 2023-12-01 VITALS — Ht 62.0 in | Wt 175.0 lb

## 2023-12-01 DIAGNOSIS — Z0000 Encounter for general adult medical examination without abnormal findings: Secondary | ICD-10-CM

## 2023-12-01 NOTE — Progress Notes (Signed)
 Subjective:   Cindy [name] is a 48 y.o. who presents for a Medicare Wellness preventive visit.  Visit Complete: Virtual I connected with  Cindy [name] and caregiver on 12/01/23 by a audio enabled telemedicine application and verified that I am speaking with the correct person using two identifiers.  Patient Location: Other:  car  Provider Location: Office/Clinic  I discussed the limitations of evaluation and management by telemedicine. The patient expressed understanding and agreed to proceed.  Vital Signs: Because this visit was a virtual/telehealth visit, some criteria may be missing or patient reported. Any vitals not documented were not able to be obtained and vitals that have been documented are patient reported.  VideoDeclined- This patient declined Librarian, academic. Therefore the visit was completed with audio only.  AWV Questionnaire: No: Patient Medicare AWV questionnaire was not completed prior to this visit.  Cardiac Risk Factors include: advanced age (>8men, >17 women);diabetes mellitus;dyslipidemia;obesity (BMI >30kg/m2)     Objective:    Today's Vitals   12/01/23 0857  Weight: 175 lb (79.4 kg)  Height: 5\' 2"  (1.575 m)  PainSc: 0-No pain   Body mass index is 32.01 kg/m.     12/01/2023    9:02 AM 11/22/2022   10:46 AM 11/15/2022    8:20 AM 06/25/2021   11:00 AM 06/27/2020    2:21 PM 03/25/2019    1:48 PM 07/31/2018    9:50 AM  Advanced Directives  Does Patient Have a Medical Advance Directive? No No No No No No No  Would patient like information on creating a medical advance directive? No - Patient declined No - Guardian declined No - Patient declined No - Patient declined No - Patient declined No - Patient declined No - Patient declined    Current Medications (verified) Outpatient Encounter Medications as of 12/01/2023  Medication Sig   Blood Glucose Monitoring Suppl (ACCU-CHEK GUIDE ME) w/Device KIT 1 each by Does  not apply route daily.   Blood Glucose Monitoring Suppl (ACCU-CHEK GUIDE ME) w/Device KIT Use to check blood sugar 1x daily. E11.9   cetirizine (ZYRTEC) 10 MG tablet TAKE (1) TABLET BY MOUTH ONCE DAILY.   cholecalciferol 25 MCG (1000 UT) tablet Take 2 tablets (2,000 Units total) by mouth daily.   escitalopram (LEXAPRO) 20 MG tablet Take 30 mg by mouth daily.   FORA Lancets MISC USE TO TEST BLOOD SUGAR ONCE DAILY.   glucose blood (ACCU-CHEK GUIDE) test strip Use as instructed   levETIRAcetam (KEPPRA) 500 MG tablet Take 1,250 mg by mouth 2 (two) times daily.   Magnesium Oxide -Mg Supplement 250 MG TABS Take 1 tablet (250 mg total) by mouth daily.   Multiple Vitamins-Minerals (SENTRY) TABS Take 1 tablet by mouth daily.   norgestimate-ethinyl estradiol (ORTHO-CYCLEN) 0.25-35 MG-MCG tablet TAKE 1 TABLET BY MOUTH ONCE DAILY (SKIPPING PLACEBO PILLS)   Oyster Shell Calcium 500 MG TABS Take 0.4 tablets (500 mg total) by mouth 2 (two) times daily with a meal.   simvastatin (ZOCOR) 40 MG tablet Take 1 tablet (40 mg total) by mouth at bedtime.   [DISCONTINUED] Semaglutide-Weight Management (WEGOVY) 0.25 MG/0.5ML SOAJ Inject 0.25 mg into the skin once a week.   [DISCONTINUED] Semaglutide-Weight Management 0.25 MG/0.5ML SOAJ Inject 0.25 mg into the skin once a week.   No facility-administered encounter medications on file as of 12/01/2023.    Allergies (verified) Patient has no known allergies.   History: Past Medical History:  Diagnosis Date   Atypical mole 09/06/2014  atypiucal nevus- left cheek-wider shave   Seizures (HCC)    Past Surgical History:  Procedure Laterality Date   cyber onyx implant  03/2016   help control sizures    Family History  Problem Relation Age of Onset   High Cholesterol Mother    High Cholesterol Father    Other Father        Pacemaker    Breast cancer Paternal Grandmother    Social History   Socioeconomic History   Marital status: Single    Spouse name: Not  on file   Number of children: Not on file   Years of education: Not on file   Highest education level: Not on file  Occupational History   Occupation: disable  Tobacco Use   Smoking status: Never   Smokeless tobacco: Never  Vaping Use   Vaping status: Never Used  Substance and Sexual Activity   Alcohol use: No   Drug use: No   Sexual activity: Never    Birth control/protection: Pill  Other Topics Concern   Not on file  Social History Narrative   [facility] age 41-41.    Never smoker, no alcohol or other drugs   Never been sexually active   Social Drivers of Corporate investment banker Strain: Low Risk  (12/01/2023)   Overall Financial Resource Strain (CARDIA)    Difficulty of Paying Living Expenses: Not hard at all  Food Insecurity: No Food Insecurity (12/01/2023)   Hunger Vital Sign    Worried About Running Out of Food in the Last Year: Never true    Ran Out of Food in the Last Year: Never true  Transportation Needs: No Transportation Needs (12/01/2023)   PRAPARE - Administrator, Civil Service (Medical): No    Lack of Transportation (Non-Medical): No  Physical Activity: Sufficiently Active (12/01/2023)   Exercise Vital Sign    Days of Exercise per Week: 3 days    Minutes of Exercise per Session: 60 min  Stress: No Stress Concern Present (12/01/2023)   [employer] of Occupational Health - Occupational Stress Questionnaire    Feeling of Stress : Only a little  Social Connections: Socially Isolated (12/01/2023)   Social Connection and Isolation Panel [NHANES]    Frequency of Communication with Friends and Family: More than three times a week    Frequency of Social Gatherings with Friends and Family: More than three times a week    Attends Religious Services: Never    Database administrator or Organizations: No    Attends Engineer, structural: Never    Marital Status: Never married    Tobacco Counseling Counseling given: Not  Answered    Clinical Intake:  Pre-visit preparation completed: Yes  Pain : No/denies pain Pain Score: 0-No pain     BMI - recorded: 32.01 Nutritional Status: BMI > 30  Obese Nutritional Risks: None Diabetes: Yes CBG done?: No Did pt. bring in CBG monitor from home?: No  How often do you need to have someone help you when you read instructions, pamphlets, or other written materials from your doctor or pharmacy?: 1 - Never What is the last grade level you completed in school?: HSG  Interpreter Needed?: No  Information entered by :: Luciann Gossett N. Adryan Shin, LPN.   Activities of Daily Living     12/01/2023    9:05 AM  In your present state of health, do you have any difficulty performing the following activities:  Hearing? 0  Vision? 0  Difficulty concentrating or making decisions? 0  Walking or climbing stairs? 0  Dressing or bathing? 0  Doing errands, shopping? 1  Preparing Food and eating ? N  Using the Toilet? N  In the past six months, have you accidently leaked urine? N  Do you have problems with loss of bowel control? N  Managing your Medications? Y  Managing your Finances? Y  Housekeeping or managing your Housekeeping? Y    Patient Care Team: Evette Georges, MD as PCP - General (Family Medicine) [place], [facility] as Consulting Physician (Ophthalmology)  Indicate any recent Medical Services you may have received from other than [facility] providers in the past year (date may be approximate).     Assessment:   This is a routine wellness examination for [place].  Hearing/Vision screen Hearing Screening - Comments:: Denies hearing difficulties.   Vision Screening - Comments:: No rx glasses - not up to date with routine eye exams. Patient will be seeing [facility] (12/22/2023)    Goals Addressed             This Visit's Progress    Client understands the importance of follow-up with providers by attending scheduled visits          Depression Screen     12/01/2023    9:04 AM 07/08/2023    4:04 PM 11/22/2022   10:47 AM 11/15/2022    8:09 AM 06/25/2021   11:00 AM 06/27/2020    2:21 PM 03/21/2020    9:57 AM  PHQ 2/9 Scores  PHQ - 2 Score 0 0 0 1 0 0 0  PHQ- 9 Score 0 2 0  0 0     Fall Risk     12/01/2023    9:04 AM 11/15/2022    8:10 AM 03/21/2020    9:56 AM  Fall Risk   Falls in the past year? 0 0 0  Number falls in past yr: 0 0 0  Injury with Fall? 0 0 0  Risk for fall due to : No Fall Risks No Fall Risks   Follow up Falls prevention discussed;Falls evaluation completed Falls evaluation completed     MEDICARE RISK AT HOME:  Medicare Risk at Home Any stairs in or around the home?: No If so, are there any without handrails?: No Home free of loose throw rugs in walkways, pet beds, electrical cords, etc?: Yes Adequate lighting in your home to reduce risk of falls?: Yes Life alert?: No Use of a cane, walker or w/c?: No Grab bars in the bathroom?: No Shower chair or bench in shower?: No Elevated toilet seat or a handicapped toilet?: No  TIMED UP AND GO:  Was the test performed?  No  Cognitive Function: 6CIT completed    12/01/2023    9:04 AM  MMSE - Mini Mental State Exam  Not completed: Unable to complete        12/01/2023    9:10 AM 11/15/2022    8:11 AM  6CIT Screen  What Year? 0 points 0 points  What month? 0 points 0 points  What time? 0 points 0 points  Count back from 20 0 points 0 points  Months in reverse 0 points 0 points  Repeat phrase 0 points 10 points  Total Score 0 points 10 points    Immunizations Immunization History  Administered Date(s) Administered   Influenza, Seasonal, Injecte, Preservative Fre 07/08/2023   Influenza,inj,Quad PF,6+ Mos 06/11/2017, 06/19/2018, 06/07/2019, 06/25/2021  Influenza-Unspecified 05/24/2016, 06/25/2021, 07/03/2022   PFIZER(Purple Top)SARS-COV-2 Vaccination 10/06/2019, 11/01/2019, 06/27/2020, 09/04/2021, 07/03/2022   Pfizer Covid-19  Vaccine Bivalent Booster 24yrs & up 09/04/2021    Screening Tests Health Maintenance  Topic Date Due   Pneumococcal Vaccine 55-61 Years old (1 of 2 - PCV) Never done   FOOT EXAM  Never done   OPHTHALMOLOGY EXAM  Never done   Diabetic kidney evaluation - Urine ACR  Never done   DTaP/Tdap/Td (1 - Tdap) Never done   Colonoscopy  Never done   Cervical Cancer Screening (HPV/Pap Cotest)  04/09/2022   COVID-19 Vaccine (6 - 2024-25 season) 05/25/2023   Diabetic kidney evaluation - eGFR measurement  11/22/2023   HEMOGLOBIN A1C  01/06/2024   MAMMOGRAM  10/22/2024   Medicare Annual Wellness (AWV)  11/30/2024   INFLUENZA VACCINE  Completed   Hepatitis C Screening  Completed   HIV Screening  Completed   HPV VACCINES  Aged Out    Health Maintenance  Health Maintenance Due  Topic Date Due   Pneumococcal Vaccine 71-51 Years old (1 of 2 - PCV) Never done   FOOT EXAM  Never done   OPHTHALMOLOGY EXAM  Never done   Diabetic kidney evaluation - Urine ACR  Never done   DTaP/Tdap/Td (1 - Tdap) Never done   Colonoscopy  Never done   Cervical Cancer Screening (HPV/Pap Cotest)  04/09/2022   COVID-19 Vaccine (6 - 2024-25 season) 05/25/2023   Diabetic kidney evaluation - eGFR measurement  11/22/2023   Health Maintenance Items Addressed: Yes, with patient and caregiver  Additional Screening:  Vision Screening: Recommended annual ophthalmology exams for early detection of glaucoma and other disorders of the eye.  Dental Screening: Recommended annual dental exams for proper oral hygiene  Community Resource Referral / Chronic Care Management: CRR required this visit?  No   CCM required this visit?  No     Plan:     I have personally reviewed and noted the following in the patient's chart:   Medical and social history Use of alcohol, tobacco or illicit drugs  Current medications and supplements including opioid prescriptions. Patient is not currently taking opioid  prescriptions. Functional ability and status Nutritional status Physical activity Advanced directives List of other physicians Hospitalizations, surgeries, and ER visits in previous 12 months Vitals Screenings to include cognitive, depression, and falls Referrals and appointments  In addition, I have reviewed and discussed with patient certain preventive protocols, quality metrics, and best practice recommendations. A written personalized care plan for preventive services as well as general preventive health recommendations were provided to patient.     Mickeal Needy, LPN   05/21/5620   After Visit Summary: (MyChart) Due to this being a telephonic visit, the after visit summary with patients personalized plan was offered to patient via MyChart   Notes: Please refer to Routing Comments.

## 2023-12-01 NOTE — Patient Instructions (Signed)
 Cindy [name] , Thank you for taking time to come for your Medicare Wellness Visit. I appreciate your ongoing commitment to your health goals. Please review the following plan we discussed and let me know if I can assist you in the future.   Referrals/Orders/Follow-Ups/Clinician Recommendations: Yes; Keep maintaining your health by keeping your appointments with Dr. Phineas Real and any specialists that you may see.  Call us if you need anything.  Have a great year!!!!  This is a list of the screening recommended for you and due dates:  Health Maintenance  Topic Date Due   Pneumococcal Vaccination (1 of 2 - PCV) Never done   Complete foot exam   Never done   Eye exam for diabetics  Never done   Yearly kidney health urinalysis for diabetes  Never done   DTaP/Tdap/Td vaccine (1 - Tdap) Never done   Colon Cancer Screening  Never done   Pap with HPV screening  04/09/2022   COVID-19 Vaccine (6 - 2024-25 season) 05/25/2023   Yearly kidney function blood test for diabetes  11/22/2023   Hemoglobin A1C  01/06/2024   Mammogram  10/22/2024   Medicare Annual Wellness Visit  11/30/2024   Flu Shot  Completed   Hepatitis C Screening  Completed   HIV Screening  Completed   HPV Vaccine  Aged Out    Advanced directives: (Declined) Advance directive discussed with you today. Even though you declined this today, please call our office should you change your mind, and we can give you the proper paperwork for you to fill out.  Next Medicare Annual Wellness Visit scheduled for next year: Yes

## 2023-12-22 ENCOUNTER — Other Ambulatory Visit: Payer: Self-pay | Admitting: Family Medicine

## 2023-12-22 LAB — HM DIABETES EYE EXAM

## 2024-01-19 ENCOUNTER — Ambulatory Visit (INDEPENDENT_AMBULATORY_CARE_PROVIDER_SITE_OTHER): Admitting: Family Medicine

## 2024-01-19 ENCOUNTER — Encounter: Payer: Self-pay | Admitting: Family Medicine

## 2024-01-19 ENCOUNTER — Other Ambulatory Visit: Payer: Self-pay | Admitting: Family Medicine

## 2024-01-19 VITALS — BP 140/78 | HR 90 | Wt 184.4 lb

## 2024-01-19 DIAGNOSIS — E785 Hyperlipidemia, unspecified: Secondary | ICD-10-CM

## 2024-01-19 DIAGNOSIS — Z87898 Personal history of other specified conditions: Secondary | ICD-10-CM | POA: Diagnosis not present

## 2024-01-19 DIAGNOSIS — R030 Elevated blood-pressure reading, without diagnosis of hypertension: Secondary | ICD-10-CM | POA: Diagnosis not present

## 2024-01-19 DIAGNOSIS — N921 Excessive and frequent menstruation with irregular cycle: Secondary | ICD-10-CM | POA: Diagnosis not present

## 2024-01-19 DIAGNOSIS — Z7985 Long-term (current) use of injectable non-insulin antidiabetic drugs: Secondary | ICD-10-CM | POA: Diagnosis not present

## 2024-01-19 DIAGNOSIS — Z0000 Encounter for general adult medical examination without abnormal findings: Secondary | ICD-10-CM

## 2024-01-19 DIAGNOSIS — Z1211 Encounter for screening for malignant neoplasm of colon: Secondary | ICD-10-CM | POA: Diagnosis not present

## 2024-01-19 DIAGNOSIS — E119 Type 2 diabetes mellitus without complications: Secondary | ICD-10-CM

## 2024-01-19 LAB — POCT GLYCOSYLATED HEMOGLOBIN (HGB A1C): HbA1c, POC (controlled diabetic range): 9.9 % — AB (ref 0.0–7.0)

## 2024-01-19 MED ORDER — ACCU-CHEK GUIDE TEST VI STRP: ORAL_STRIP | 12 refills | Status: DC

## 2024-01-19 MED ORDER — METFORMIN HCL ER 500 MG PO TB24: ORAL_TABLET | ORAL | 0 refills | Status: DC

## 2024-01-19 MED ORDER — FORA LANCETS MISC: 11 refills | Status: DC

## 2024-01-19 NOTE — Progress Notes (Unsigned)
    SUBJECTIVE:   CHIEF COMPLAINT / HPI:   T2DM Denied for semaglutide  at last visit, though she was able to take samples provided by the clinic.  Last A1c 7.5.  Metformin was discussed, though unfortunately, there was miscommunication on when to send in metformin. She recounts diet high in chips, ranch for salad, and sweets. Family feel her portions could be smaller, as well.  Elevated blood pressure reading Elevated at last visit.  Bleeding Had an episode of large volume bleeding in her underpants in November. She is on birth control and skips the placebo pill, so she usually does not have a period. This "breakthrough" bleeding happened for 2 days. No other bleeding since then. Was first time having any vaginal bleeding in years, though this seems to happen every 2-3 years. Feels she could have missed her birth control at that time. No pelvic pain.  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 Flying to [place]  with family this summer.  OBJECTIVE:   BP (!) 145/85   Pulse 90   Wt 184 lb 6.4 oz (83.6 kg)   SpO2 98%   BMI 33.73 kg/m   General: Alert and oriented, in NAD Skin: Warm, dry, and intact without lesions HEENT: NCAT, EOM grossly normal, midline nasal septum Cardiac: RRR, no m/r/g appreciated Respiratory: CTAB, breathing and speaking comfortably on RA Abdominal: Soft, nontender, nondistended, normoactive bowel sounds Extremities: Moves all extremities grossly equally Neurological: No gross focal deficit Psychiatric: Appropriate mood and affect  ASSESSMENT/PLAN:   Assessment & Plan Type 2 diabetes mellitus without complication, without long-term current use of insulin (HCC)  Hyperlipidemia, unspecified hyperlipidemia type  Elevated blood pressure reading    Eye doctor at [facility] ophthalmology Metformin and Attempt ozempic  for t2dm  Genetta Kenning, MD [facility]

## 2024-01-19 NOTE — Patient Instructions (Addendum)
 You are doing a great job! Congratulations on [employer]!  I have sent in metformin to take for your blood sugar. I have also sent in more lancets and test strips to record the blood sugar.  If you have bleeding again, please let us  know as soon as possible!  Take your blood pressures at home twice a day so we can see if your blood pressure is high at home, too.  Call me in a month to let me know how things are going. Come back in person in 3 months to recheck blood sugar.

## 2024-01-20 ENCOUNTER — Other Ambulatory Visit (HOSPITAL_COMMUNITY): Payer: Self-pay

## 2024-01-20 ENCOUNTER — Encounter: Payer: Self-pay | Admitting: Family Medicine

## 2024-01-20 LAB — BASIC METABOLIC PANEL WITH GFR
BUN/Creatinine Ratio: 20 (ref 9–23)
BUN: 16 mg/dL (ref 6–24)
CO2: 24 mmol/L (ref 20–29)
Calcium: 9.8 mg/dL (ref 8.7–10.2)
Chloride: 97 mmol/L (ref 96–106)
Creatinine, Ser: 0.8 mg/dL (ref 0.57–1.00)
Glucose: 386 mg/dL — ABNORMAL HIGH (ref 70–99)
Potassium: 5.1 mmol/L (ref 3.5–5.2)
Sodium: 136 mmol/L (ref 134–144)
eGFR: 91 mL/min/{1.73_m2} (ref >59)

## 2024-01-20 LAB — MICROALBUMIN / CREATININE URINE RATIO
Creatinine, Urine: 32.3 mg/dL
Microalb/Creat Ratio: 1333 mg/g{creat} — ABNORMAL HIGH (ref 0–29)
Microalbumin, Urine: 430.4 ug/mL

## 2024-01-20 LAB — LIPID PANEL
Chol/HDL Ratio: 3.3 ratio (ref 0.0–4.4)
Cholesterol, Total: 187 mg/dL (ref 100–199)
HDL: 57 mg/dL (ref >39)
LDL Chol Calc (NIH): 58 mg/dL (ref 0–99)
Triglycerides: 479 mg/dL — ABNORMAL HIGH (ref 0–149)
VLDL Cholesterol Cal: 72 mg/dL — ABNORMAL HIGH (ref 5–40)

## 2024-01-20 MED ORDER — SEMAGLUTIDE(0.25 OR 0.5MG/DOS) 2 MG/3ML ~~LOC~~ SOPN
0.2500 mg | PEN_INJECTOR | SUBCUTANEOUS | 0 refills | Status: DC
Start: 2024-01-20 — End: 2024-02-12

## 2024-01-20 NOTE — Assessment & Plan Note (Addendum)
 Most recent mammogram 2025 without evidence of malignancy.  Continue screening.

## 2024-01-20 NOTE — Assessment & Plan Note (Signed)
 Not at goal.  Through shared decision making, will hold off on antihypertensives for now.  Hope that increased exercise, continue dietary changes, and metformin/Ozempic  dosing will help bring this down without medications.  Gave patient blood pressure log to take blood pressure at home twice a day.  Educated on how to properly take measurements.  Follow-up at next visit in 3 months.

## 2024-01-20 NOTE — Assessment & Plan Note (Signed)
 A1c today 9.9, increased from 7.5 six months ago.  Dietary indiscretion and age playing a large role.  Patient and family highly averse to insulin administration given patient's developmental delay; will attempt metformin and titrate to max dosage.  Will also send in Ozempic  to be used concurrently given patient is unlikely to achieve goal A1c with metformin alone.  Will update urine microalbumin/creatinine today and obtain BMP for kidney function.  Plan to follow-up A1c in 3 months.  Family to let me know how things are going in 1 month.

## 2024-01-20 NOTE — Assessment & Plan Note (Addendum)
 Appears to have been an issue in the past and has been seen by OB/GYN.  Patient has been on OCPs given unable to tolerate exams for an IUD placement or Nexplanon due to intellectual delay.  This most recent episode that was 2 days in nature that has not recurred and is not accompanied by any pain could have been due to missed birth control dosing versus bleeding from her known fibroid.  Advised that if bleeding recurs or if she develops pelvic pain to let me know for further evaluation.

## 2024-01-20 NOTE — Assessment & Plan Note (Signed)
 Last LDL normal.  Will repeat lipid panel today.

## 2024-01-21 ENCOUNTER — Encounter: Payer: Self-pay | Admitting: Family Medicine

## 2024-01-23 ENCOUNTER — Telehealth: Payer: Self-pay | Admitting: Family Medicine

## 2024-01-23 NOTE — Telephone Encounter (Signed)
 Spoke with caregiver Latithia about medication changes. Expressed concern with multiple medications and risk of lowering blood glucose. Explained risk of hypoglycemia with Ozempic  (which is covered by Medicaid!) and metformin  is low. They would still like to monitor every so often; they will follow up with pharmacy to ensure they have supplies (sent refill at last visit).  Also discussed holding off on losartan for BP and proteinuria for now given other multiple medications and Cindy [name]'s discomfort with doctors/medicines. Will follow up A1c and BP in 3 months and further discuss. They will make this appointment closer to time.  Also discussed new recommendations for colonoscopy at 45 versus previously at age 75. They will schedule.  Otherwise, Letithia will send me a message in 1 month or sooner to update me on how she is doing with her T2DM regimen.

## 2024-02-05 ENCOUNTER — Encounter: Payer: Self-pay | Admitting: Family Medicine

## 2024-02-05 DIAGNOSIS — E119 Type 2 diabetes mellitus without complications: Secondary | ICD-10-CM

## 2024-02-12 ENCOUNTER — Other Ambulatory Visit: Payer: Self-pay | Admitting: Family Medicine

## 2024-02-12 DIAGNOSIS — E119 Type 2 diabetes mellitus without complications: Secondary | ICD-10-CM

## 2024-02-12 MED ORDER — OZEMPIC (0.25 OR 0.5 MG/DOSE) 2 MG/3ML ~~LOC~~ SOPN: 0.5000 mg | PEN_INJECTOR | SUBCUTANEOUS | 0 refills | Status: DC

## 2024-02-12 MED ORDER — METFORMIN HCL ER 500 MG PO TB24: 1000.0000 mg | ORAL_TABLET | Freq: Two times a day (BID) | ORAL | 3 refills | Status: DC

## 2024-03-29 ENCOUNTER — Other Ambulatory Visit: Payer: Self-pay | Admitting: Family Medicine

## 2024-03-29 DIAGNOSIS — E119 Type 2 diabetes mellitus without complications: Secondary | ICD-10-CM

## 2024-04-15 ENCOUNTER — Ambulatory Visit: Admitting: Family Medicine

## 2024-04-15 ENCOUNTER — Other Ambulatory Visit: Payer: Self-pay | Admitting: Family Medicine

## 2024-04-15 VITALS — BP 150/88 | HR 92 | Wt 180.6 lb

## 2024-04-15 DIAGNOSIS — Z1211 Encounter for screening for malignant neoplasm of colon: Secondary | ICD-10-CM | POA: Insufficient documentation

## 2024-04-15 DIAGNOSIS — I152 Hypertension secondary to endocrine disorders: Secondary | ICD-10-CM

## 2024-04-15 DIAGNOSIS — Z0000 Encounter for general adult medical examination without abnormal findings: Secondary | ICD-10-CM

## 2024-04-15 DIAGNOSIS — E1159 Type 2 diabetes mellitus with other circulatory complications: Secondary | ICD-10-CM

## 2024-04-15 DIAGNOSIS — E119 Type 2 diabetes mellitus without complications: Secondary | ICD-10-CM | POA: Diagnosis present

## 2024-04-15 DIAGNOSIS — N921 Excessive and frequent menstruation with irregular cycle: Secondary | ICD-10-CM | POA: Diagnosis not present

## 2024-04-15 LAB — POCT GLYCOSYLATED HEMOGLOBIN (HGB A1C): HbA1c, POC (controlled diabetic range): 7.5 % — AB (ref 0.0–7.0)

## 2024-04-15 MED ORDER — SEMAGLUTIDE (1 MG/DOSE) 4 MG/3ML ~~LOC~~ SOPN
1.0000 mg | PEN_INJECTOR | SUBCUTANEOUS | 0 refills | Status: DC
Start: 2024-04-15 — End: 2024-07-01

## 2024-04-15 MED ORDER — VALSARTAN 80 MG PO TABS: 80.0000 mg | ORAL_TABLET | Freq: Every day | ORAL | 0 refills | Status: DC

## 2024-04-15 NOTE — Assessment & Plan Note (Signed)
 Persistently elevated. Asymptomatic. Will start valsartan  80 mg daily for BP control and renal protection with diabetes and proteinuria. They will send me BP values over the next week on MyChart. They will come in next week for lab appointment to monitor Cr and K.

## 2024-04-15 NOTE — Patient Instructions (Addendum)
 Come back Monday morning for a lab visit to check on your kidneys.  Go down to metformin  1000 mg once per day. I will send in Ozempic  1 mg weekly.  I have sent in valsartan  80 mg to be taken once daily.  For your bleeding, it could be due to fibroids. If this continues, let me know, and we may need to complete another ultrasound or get you in with OB/GYN for further evaluation.  We completed your FL2 form.

## 2024-04-15 NOTE — Assessment & Plan Note (Signed)
 FL2 form completed, scanned into chart, and given back to caregiver. Colonoscopy is scheduled.

## 2024-04-15 NOTE — Progress Notes (Signed)
    SUBJECTIVE:   CHIEF COMPLAINT / HPI:   T2DM Has been taking Ozempic  0.5 mg weekly and metformin  1000 BID without issue.  HTN Continues to climb at home. No headaches, SOB, or chest pain.  Bleeding Occurred again at the end of May and first of June. Heavy bleeding lasted for 2 weeks every single day. No current bleeding. She takes her birth control daily without a placebo, so she does not have withdrawal bleeding. Has been felt to be due to breakthrough in the past. No current pain.  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; she has intellectual disability, epilepsy, and obesity  OBJECTIVE:   BP (!) 150/88   Pulse 92   Wt 180 lb 9.6 oz (81.9 kg)   SpO2 99%   BMI 33.03 kg/m   General: Alert and oriented, in NAD Skin: Warm, dry, and intact without lesions HEENT: NCAT, EOM grossly normal, midline nasal septum Cardiac: RRR, no m/r/g appreciated Respiratory: CTAB, breathing and speaking comfortably on RA Extremities: Moves all extremities grossly equally Neurological: No gross focal deficit Psychiatric: Appropriate mood and affect   ASSESSMENT/PLAN:   Assessment & Plan Type 2 diabetes mellitus without complication, without long-term current use of insulin (HCC) A1c improved to 7.5 today. Congratulated on excellent work. Will increase ozempic  to 1 mg weekly and decrease metformin  to 1000 mg daily. Follow up in 3 months and possibly discontinue metformin  all together if at goal 7 or less. Hypertension associated with diabetes (HCC) Persistently elevated. Asymptomatic. Will start valsartan  80 mg daily for BP control and renal protection with diabetes and proteinuria. They will send me BP values over the next week on MyChart. They will come in next week for lab appointment to monitor Cr and K. Breakthrough bleeding on OCPs Discussed with caregiver about speaking to patient's mother about referral to OB/GYN to aid in possible sedation (given  intellectual delay) for IUD insertion to better help with breakthrough bleeding and possible component of fibroids (and cessation of OCPs). This would also be an ideal time to complete her pap smear. They will get back to me about this. Healthcare maintenance FL2 form completed, scanned into chart, and given back to caregiver. Colonoscopy is scheduled.   Stuart Redo, MD [facility]

## 2024-04-15 NOTE — Assessment & Plan Note (Signed)
 A1c improved to 7.5 today. Congratulated on excellent work. Will increase ozempic  to 1 mg weekly and decrease metformin  to 1000 mg daily. Follow up in 3 months and possibly discontinue metformin  all together if at goal 7 or less.

## 2024-04-15 NOTE — Assessment & Plan Note (Signed)
 Discussed with caregiver about speaking to patient's mother about referral to OB/GYN to aid in possible sedation (given intellectual delay) for IUD insertion to better help with breakthrough bleeding and possible component of fibroids (and cessation of OCPs). This would also be an ideal time to complete her pap smear. They will get back to me about this.

## 2024-04-18 ENCOUNTER — Other Ambulatory Visit: Payer: Self-pay | Admitting: Family Medicine

## 2024-04-19 ENCOUNTER — Other Ambulatory Visit

## 2024-04-19 ENCOUNTER — Other Ambulatory Visit: Payer: Self-pay | Admitting: Family Medicine

## 2024-04-19 DIAGNOSIS — I152 Hypertension secondary to endocrine disorders: Secondary | ICD-10-CM

## 2024-04-19 MED ORDER — NORGESTIMATE-ETH ESTRADIOL 0.25-35 MG-MCG PO TABS: 1.0000 | ORAL_TABLET | Freq: Every day | ORAL | 11 refills | Status: AC

## 2024-04-20 ENCOUNTER — Ambulatory Visit: Payer: Self-pay | Admitting: Family Medicine

## 2024-04-20 LAB — BASIC METABOLIC PANEL WITH GFR
BUN/Creatinine Ratio: 16 (ref 9–23)
BUN: 14 mg/dL (ref 6–24)
CO2: 21 mmol/L (ref 20–29)
Calcium: 9.7 mg/dL (ref 8.7–10.2)
Chloride: 99 mmol/L (ref 96–106)
Creatinine, Ser: 0.9 mg/dL (ref 0.57–1.00)
Glucose: 261 mg/dL — ABNORMAL HIGH (ref 70–99)
Potassium: 4.8 mmol/L (ref 3.5–5.2)
Sodium: 137 mmol/L (ref 134–144)
eGFR: 79 mL/min/1.73 (ref >59)

## 2024-05-20 ENCOUNTER — Ambulatory Visit: Admitting: Gastroenterology

## 2024-05-20 ENCOUNTER — Encounter: Payer: Self-pay | Admitting: Gastroenterology

## 2024-05-20 VITALS — BP 136/74 | HR 86 | Ht 62.0 in | Wt 180.4 lb

## 2024-05-20 DIAGNOSIS — Z01818 Encounter for other preprocedural examination: Secondary | ICD-10-CM

## 2024-05-20 DIAGNOSIS — Z1211 Encounter for screening for malignant neoplasm of colon: Secondary | ICD-10-CM

## 2024-05-20 MED ORDER — NA SULFATE-K SULFATE-MG SULF 17.5-3.13-1.6 GM/177ML PO SOLN: 1.0000 | Freq: Once | ORAL | 0 refills | Status: AC

## 2024-05-20 NOTE — Progress Notes (Signed)
 05/20/2024 Cindy [name] [id number] [birth year]   HISTORY OF PRESENT ILLNESS: This is a very pleasant 48 year old female who is new to our office.  She has been referred here to discuss and schedule colonoscopy.  She is here today with her caregiver who has been with her for 20 years.  Patient has intellectual delay and epilepsy, but last seizure was almost 11 years ago.  Patient has never had a colonoscopy in the past.  She moves her bowels regularly.  No rectal bleeding.  She has had some vaginal bleeding that they have not been trying to work out, but no rectal bleeding.  She participates in [employer] with swimming and has 4 gold metals this year.  She has over 20 years in with the [employer].  Also plays tennis and bocce ball.  She goes to [place] with her parents and swims with the dolphins.   Past Medical History:  Diagnosis Date   Atypical mole 09/06/2014   atypiucal nevus- left cheek-wider shave   Epilepsy (HCC)    Seizures (HCC)    Past Surgical History:  Procedure Laterality Date   cyber onyx implant  03/2016   help control sizures     reports that she has never smoked. She has never used smokeless tobacco. She reports that she does not drink alcohol and does not use drugs. family history includes Breast cancer in her paternal grandmother; High Cholesterol in her father and mother; Other in her father. No Known Allergies    Outpatient Encounter Medications as of 05/20/2024  Medication Sig   cetirizine  (ZYRTEC ) 10 MG tablet TAKE (1) TABLET BY MOUTH ONCE DAILY.   cholecalciferol 25 MCG (1000 UT) tablet Take 2 tablets (2,000 Units total) by mouth daily.   escitalopram (LEXAPRO) 20 MG tablet Take 30 mg by mouth daily.   levETIRAcetam (KEPPRA) 500 MG tablet Take 1,250 mg by mouth 2 (two) times daily.   Magnesium  Oxide -Mg Supplement 250 MG TABS Take 1 tablet (250 mg total) by mouth daily.   metFORMIN  (GLUCOPHAGE -XR) 500 MG 24 hr  tablet Take 2 tablets (1,000 mg total) by mouth 2 (two) times daily with a meal.   Multiple Vitamins-Minerals (SENTRY) TABS Take 1 tablet by mouth daily.   norgestimate -ethinyl estradiol  (ESTARYLLA) 0.25-35 MG-MCG tablet Take 1 tablet by mouth daily. Skip placebo pills.   Oyster Shell Calcium  500 MG TABS Take 0.4 tablets (500 mg total) by mouth 2 (two) times daily with a meal.   Semaglutide , 1 MG/DOSE, 4 MG/3ML SOPN Inject 1 mg into the skin once a week.   simvastatin  (ZOCOR ) 40 MG tablet TAKE 1 TABLET BY MOUTH EVERY NIGHT AT BEDTIME   valsartan  (DIOVAN ) 80 MG tablet TAKE 1 TABLET(80 MG) BY MOUTH DAILY   [DISCONTINUED] Blood Glucose Monitoring Suppl (ACCU-CHEK GUIDE ME) w/Device KIT 1 each by Does not apply route daily.   [DISCONTINUED] Blood Glucose Monitoring Suppl (ACCU-CHEK GUIDE ME) w/Device KIT Use to check blood sugar 1x daily. E11.9   [DISCONTINUED] FORA Lancets MISC USE TO TEST BLOOD SUGAR ONCE DAILY.   [DISCONTINUED] glucose blood (ACCU-CHEK GUIDE TEST) test strip Use as instructed   No facility-administered encounter medications on file as of 05/20/2024.     REVIEW OF SYSTEMS  : All other systems reviewed and negative except where noted in the History of Present Illness.   PHYSICAL EXAM: BP 136/74 (BP Location: Left Arm, Patient Position: Sitting, Cuff Size: Normal)   Pulse 86  Ht 5' 2 (1.575 m)   Wt 180 lb 6 oz (81.8 kg)   BMI 32.99 kg/m  General: Well developed white female in no acute distress Head: Normocephalic and atraumatic Eyes:  Sclerae anicteric, conjunctiva pink. Ears: Normal auditory acuity Lungs: Clear throughout to auscultation; no W/R/R. Heart: Regular rate and rhythm; no M/R/G. Rectal: Will be done at the time of colonoscopy. Musculoskeletal: Symmetrical with no gross deformities  Skin: No lesions on visible extremities Extremities: No edema. Neurological: Alert oriented x 4, grossly non-focal Psychological:  Alert and cooperative. Normal mood and  affect  ASSESSMENT AND PLAN: *CRC screening: Patient has never had a colonoscopy in the past.  Scheduled with Dr. Albertus.  She has epilepsy, but her last seizure was almost 11 years ago.  Ok for [employer].  The risks, benefits, and alternatives to colonoscopy were discussed with the patient and she consents to proceed.   CC:  Tharon Lung, MD

## 2024-05-20 NOTE — Patient Instructions (Addendum)
 You have been scheduled for a colonoscopy. Please follow written instructions given to you at your visit today.   If you use inhalers (even only as needed), please bring them with you on the day of your procedure.  DO NOT TAKE 7 DAYS PRIOR TO TEST- Trulicity (dulaglutide) Ozempic , Wegovy  (semaglutide ) Mounjaro (tirzepatide) Bydureon Bcise (exanatide extended release)  DO NOT TAKE 1 DAY PRIOR TO YOUR TEST Rybelsus  (semaglutide ) Adlyxin (lixisenatide) Victoza (liraglutide) Byetta (exanatide) _______________________________________________________  If your blood pressure at your visit was 140/90 or greater, please contact your primary care physician to follow up on this. _______________________________________________________  If you are age 58 or older, your body mass index should be between 23-30. Your Body mass index is 32.99 kg/m. If this is out of the aforementioned range listed, please consider follow up with your Primary Care Provider.  If you are age 79 or younger, your body mass index should be between 19-25. Your Body mass index is 32.99 kg/m. If this is out of the aformentioned range listed, please consider follow up with your Primary Care Provider.   ________________________________________________________  The [facility] providers would like to encourage you to use MYCHART to communicate with providers for non-urgent requests or questions.  Due to long hold times on the telephone, sending your provider a message by [facility] may be a faster and more efficient way to get a response.  Please allow 48 business hours for a response.  Please remember that this is for non-urgent requests.  _______________________________________________________  [facility] is using a team-based approach to care.  Your team is made up of your doctor and two to three APPS. Our APPS (Nurse Practitioners and Physician Assistants) work with your physician to ensure care continuity for you.  They are fully qualified to address your health concerns and develop a treatment plan. They communicate directly with your gastroenterologist to care for you. Seeing the Advanced Practice Practitioners on your physician's team can help you by facilitating care more promptly, often allowing for earlier appointments, access to diagnostic testing, procedures, and other specialty referrals.

## 2024-06-04 ENCOUNTER — Ambulatory Visit: Admitting: Internal Medicine

## 2024-06-04 ENCOUNTER — Encounter: Payer: Self-pay | Admitting: Internal Medicine

## 2024-06-04 VITALS — BP 96/66 | HR 78 | Temp 97.8°F | Resp 16 | Ht 62.0 in | Wt 72.7 lb

## 2024-06-04 DIAGNOSIS — D123 Benign neoplasm of transverse colon: Secondary | ICD-10-CM | POA: Diagnosis not present

## 2024-06-04 DIAGNOSIS — Z1211 Encounter for screening for malignant neoplasm of colon: Secondary | ICD-10-CM

## 2024-06-04 MED ORDER — SODIUM CHLORIDE 0.9 % IV SOLN: 500.0000 mL | INTRAVENOUS | Status: DC

## 2024-06-04 MED ORDER — DEXTROSE 5 % IV SOLN: INTRAVENOUS | Status: AC

## 2024-06-04 NOTE — Op Note (Signed)
 [facility] Patient Name: Cindy [name] Procedure Date: 06/04/2024 12:29 PM MRN: [id number] Endoscopist: Gordy CHRISTELLA Starch , MD, [id number] Age: 48 Referring MD:  Date of Birth: 1976-09-18 Gender: Female Account #: [id number] Procedure:                Colonoscopy Indications:              Screening for colorectal malignant neoplasm, This                            is the patient's first colonoscopy Medicines:                Monitored Anesthesia Care Procedure:                Pre-Anesthesia Assessment:                           - Prior to the procedure, a History and Physical                            was performed, and patient medications and                            allergies were reviewed. The patient's tolerance of                            previous anesthesia was also reviewed. The risks                            and benefits of the procedure and the sedation                            options and risks were discussed with the patient.                            All questions were answered, and informed consent                            was obtained. Prior Anticoagulants: The patient has                            taken no anticoagulant or antiplatelet agents. ASA                            Grade Assessment: III - A patient with severe                            systemic disease. After reviewing the risks and                            benefits, the patient was deemed in satisfactory                            condition to undergo the procedure.  After obtaining informed consent, the colonoscope                            was passed under direct vision. Throughout the                            procedure, the patient's blood pressure, pulse, and                            oxygen saturations were monitored continuously. The                            CF [id number] #[id number] was introduced through the anus                            and advanced to  the cecum, identified by                            appendiceal orifice and ileocecal valve. The                            colonoscopy was performed without difficulty. The                            patient tolerated the procedure well. The quality                            of the bowel preparation was good. The ileocecal                            valve, appendiceal orifice, and rectum were                            photographed. Scope In: 1:26:50 PM Scope Out: 1:39:47 PM Scope Withdrawal Time: 0 hours 10 minutes 10 seconds  Total Procedure Duration: 0 hours 12 minutes 57 seconds  Findings:                 The perianal and digital rectal examinations were                            normal.                           A 7 mm polyp was found in the transverse colon. The                            polyp was sessile. The polyp was removed with a                            cold snare. Resection and retrieval were complete.                           The exam was otherwise without abnormality on  direct and retroflexion views. Complications:            No immediate complications. Estimated Blood Loss:     Estimated blood loss: none. Impression:               - One 7 mm polyp in the transverse colon, removed                            with a cold snare. Resected and retrieved.                           - The examination was otherwise normal on direct                            and retroflexion views. Recommendation:           - Patient has a contact number available for                            emergencies. The signs and symptoms of potential                            delayed complications were discussed with the                            patient. Return to normal activities tomorrow.                            Written discharge instructions were provided to the                            patient.                           - Resume previous diet.                            - Continue present medications.                           - Await pathology results.                           - Repeat colonoscopy is recommended for                            surveillance. The colonoscopy date will be                            determined after pathology results from today's                            exam become available for review. Gordy CHRISTELLA Starch, MD 06/04/2024 1:42:08 PM This report has been signed electronically.

## 2024-06-04 NOTE — Progress Notes (Signed)
 Vss nad trans to pacu

## 2024-06-04 NOTE — Progress Notes (Signed)
 Pt's states no medical or surgical changes since previsit or office visit.

## 2024-06-04 NOTE — Progress Notes (Signed)
 Called to room to assist during endoscopic procedure.  Patient ID and intended procedure confirmed with present staff. Received instructions for my participation in the procedure from the performing physician.

## 2024-06-04 NOTE — Patient Instructions (Signed)

## 2024-06-04 NOTE — Progress Notes (Signed)
 See office note dated 05/20/2024 for details of current H&P  Patient presenting for screening colonoscopy.  She remains appropriate for colonoscopy in the [facility] today.

## 2024-06-07 ENCOUNTER — Telehealth: Payer: Self-pay

## 2024-06-07 NOTE — Telephone Encounter (Signed)
 Unable to reach pt.  Left HIPAA compliant voicemail.

## 2024-06-09 LAB — SURGICAL PATHOLOGY

## 2024-06-18 ENCOUNTER — Ambulatory Visit: Payer: Self-pay | Admitting: Internal Medicine

## 2024-07-01 ENCOUNTER — Encounter: Payer: Self-pay | Admitting: Family Medicine

## 2024-07-01 ENCOUNTER — Other Ambulatory Visit: Payer: Self-pay | Admitting: Family Medicine

## 2024-07-01 DIAGNOSIS — E119 Type 2 diabetes mellitus without complications: Secondary | ICD-10-CM

## 2024-07-01 MED ORDER — SEMAGLUTIDE (1 MG/DOSE) 4 MG/3ML ~~LOC~~ SOPN: 1.0000 mg | PEN_INJECTOR | SUBCUTANEOUS | 0 refills | Status: DC

## 2024-07-07 ENCOUNTER — Other Ambulatory Visit: Payer: Self-pay | Admitting: Family Medicine

## 2024-07-21 ENCOUNTER — Other Ambulatory Visit: Payer: Self-pay | Admitting: Family Medicine

## 2024-07-27 ENCOUNTER — Ambulatory Visit: Payer: Self-pay | Admitting: Family Medicine

## 2024-07-27 ENCOUNTER — Encounter: Payer: Self-pay | Admitting: Family Medicine

## 2024-07-27 VITALS — BP 118/58 | HR 88 | Wt 183.0 lb

## 2024-07-27 DIAGNOSIS — E119 Type 2 diabetes mellitus without complications: Secondary | ICD-10-CM

## 2024-07-27 DIAGNOSIS — N939 Abnormal uterine and vaginal bleeding, unspecified: Secondary | ICD-10-CM

## 2024-07-27 DIAGNOSIS — R030 Elevated blood-pressure reading, without diagnosis of hypertension: Secondary | ICD-10-CM

## 2024-07-27 LAB — POCT GLYCOSYLATED HEMOGLOBIN (HGB A1C): HbA1c, POC (controlled diabetic range): 7.9 % — AB (ref 0.0–7.0)

## 2024-07-27 MED ORDER — OZEMPIC (2 MG/DOSE) 8 MG/3ML ~~LOC~~ SOPN: 2.0000 mg | PEN_INJECTOR | SUBCUTANEOUS | 0 refills | Status: DC

## 2024-07-27 MED ORDER — VALSARTAN 80 MG PO TABS: 40.0000 mg | ORAL_TABLET | Freq: Every day | ORAL | Status: AC

## 2024-07-27 NOTE — Progress Notes (Signed)
   SUBJECTIVE:   CHIEF COMPLAINT / HPI:  Discussed the use of AI scribe software for clinical note transcription with the patient, who gave verbal consent to proceed.  History of Present Illness Cindy [name] is a 48 year old female with hypertension and diabetes who presents for medication management and follow-up.  Her blood pressure medication is effective without significant side effects. She experiences issues with her Ozempic  prescription, with inconsistencies in dosage refills between 0.5 mg and 1 mg. She is currently on a 1 mg dose and tolerates it well without appetite changes. She continues to take metformin .   OBJECTIVE:  BP (!) 118/58   Pulse 88   Wt 183 lb (83 kg)   SpO2 99%   BMI 33.47 kg/m   Physical Exam GENERAL: Alert, cooperative, well developed, no acute distress HEENT: Normocephalic, normal oropharynx, moist mucous membranes CHEST: Clear to auscultation bilaterally, no wheezes, rhonchi, or crackles CARDIOVASCULAR: Normal heart rate and rhythm, S1 and S2 normal without murmurs ABDOMEN: Soft, non-tender, non-distended, without organomegaly, normal bowel sounds EXTREMITIES: No cyanosis or edema NEUROLOGICAL: Cranial nerves grossly intact, moves all extremities without gross motor or sensory deficit  ASSESSMENT/PLAN:   Assessment & Plan Type 2 diabetes mellitus without complication, without long-term current use of insulin (HCC) A1c stable at 7.9. Increase ozempic  to 2 mg weekly. Continue metformin  for now. Will refer to nutrition therapy. Return in 3 months. Elevated blood pressure reading Reduce diovan  to 40 mg daily given lower DBP. They will take values at home and send them to me. Update BMP today. Abnormal uterine bleeding Most likely due to fibroids. Update CBC today. Given recurrence, age, and history of intellectual delay complicating cervical cancer screening, will refer back to GYN for further evaluation and management.  Stuart Redo, MD [facility]  [facility]

## 2024-07-27 NOTE — Assessment & Plan Note (Addendum)
 Reduce diovan  to 40 mg daily given lower DBP. They will take values at home and send them to me. Update BMP today.

## 2024-07-27 NOTE — Patient Instructions (Addendum)
 GOOD LUCK ON THE GAMES!  I have sent in 2 mg Ozempic  pens. Continue metformin  for now. Let's recheck in 3 months and see if we can get off of the metformin .  Take HALF of the blood pressure pill and monitor BP at home. We collected labs today.

## 2024-07-27 NOTE — Assessment & Plan Note (Addendum)
 A1c stable at 7.9. Increase ozempic  to 2 mg weekly. Continue metformin  for now. Will refer to nutrition therapy. Return in 3 months.

## 2024-07-28 ENCOUNTER — Ambulatory Visit: Payer: Self-pay | Admitting: Family Medicine

## 2024-07-28 LAB — BASIC METABOLIC PANEL WITH GFR
BUN/Creatinine Ratio: 17 (ref 9–23)
BUN: 14 mg/dL (ref 6–24)
CO2: 24 mmol/L (ref 20–29)
Calcium: 10.3 mg/dL — ABNORMAL HIGH (ref 8.7–10.2)
Chloride: 100 mmol/L (ref 96–106)
Creatinine, Ser: 0.81 mg/dL (ref 0.57–1.00)
Glucose: 131 mg/dL — ABNORMAL HIGH (ref 70–99)
Potassium: 4.5 mmol/L (ref 3.5–5.2)
Sodium: 140 mmol/L (ref 134–144)
eGFR: 89 mL/min/1.73 (ref >59)

## 2024-07-28 LAB — CBC WITH DIFFERENTIAL/PLATELET
Basophils Absolute: 0 x10E3/uL (ref 0.0–0.2)
Basos: 0 %
EOS (ABSOLUTE): 0.1 x10E3/uL (ref 0.0–0.4)
Eos: 1 %
Hematocrit: 42.9 % (ref 34.0–46.6)
Hemoglobin: 14.7 g/dL (ref 11.1–15.9)
Immature Grans (Abs): 0 x10E3/uL (ref 0.0–0.1)
Immature Granulocytes: 0 %
Lymphocytes Absolute: 3.1 x10E3/uL (ref 0.7–3.1)
Lymphs: 40 %
MCH: 32 pg (ref 26.6–33.0)
MCHC: 34.3 g/dL (ref 31.5–35.7)
MCV: 94 fL (ref 79–97)
Monocytes Absolute: 0.6 x10E3/uL (ref 0.1–0.9)
Monocytes: 8 %
Neutrophils Absolute: 3.9 x10E3/uL (ref 1.4–7.0)
Neutrophils: 51 %
Platelets: 262 x10E3/uL (ref 150–450)
RBC: 4.59 x10E6/uL (ref 3.77–5.28)
RDW: 12.2 % (ref 11.7–15.4)
WBC: 7.7 x10E3/uL (ref 3.4–10.8)

## 2024-08-15 ENCOUNTER — Other Ambulatory Visit: Payer: Self-pay | Admitting: Family Medicine

## 2024-08-23 ENCOUNTER — Other Ambulatory Visit: Payer: Self-pay | Admitting: Family Medicine

## 2024-08-26 ENCOUNTER — Other Ambulatory Visit: Payer: Self-pay

## 2024-08-26 ENCOUNTER — Encounter: Payer: Self-pay | Admitting: Dietician

## 2024-08-26 ENCOUNTER — Encounter: Admitting: Dietician

## 2024-08-26 VITALS — Ht 60.0 in | Wt 181.0 lb

## 2024-08-26 DIAGNOSIS — E119 Type 2 diabetes mellitus without complications: Secondary | ICD-10-CM | POA: Insufficient documentation

## 2024-08-26 MED ORDER — FLUZONE 0.5 ML IM SUSY
0.5000 mL | PREFILLED_SYRINGE | Freq: Once | INTRAMUSCULAR | 0 refills | Status: AC
  Filled 2024-08-26: qty 0.5, 1d supply, fill #0

## 2024-08-26 NOTE — Patient Instructions (Addendum)
 Continue to stay active!  Continue to drink plenty of water.  Continue foods that are mostly baked, grill, or other low fat preparation. Continue 1/2 your plate vegetables.  Consider:  1/2 sandwich or open faced sandwich  Decreased portion size and frequency of cereal  Add fresh fruit with breakfast  Salad or soup more often for lunch  Less creamer in the coffee, use low fat milk for part of this.

## 2024-08-26 NOTE — Progress Notes (Signed)
 Nutrition Therapy  Appointment Start time:  [phone]  Appointment End time:  1135 Patient is here today with her provider.  Primary concerns today: A1C went up despite Ozempic  and Metformin .  They would like lifestyle changes to improve her blood glucose. Paper assessment shows that patient is in denial about her diabetes. Referral diagnosis: Type 2 Diabetes Preferred learning style: visual, hands on Learning readiness: contemplating, change in progress   NUTRITION ASSESSMENT  60 181 lbs No recent weight change.  Clinical Medical Hx: Type 2 Diabetes (2021), seizures Medications: Metformin , estarylla, Ozempic  (2 mg) Labs: A1C:  7.9% 07/27/2024 and 7.5 % 4 months prior Notable Signs/Symptoms: isn't losing weight on ozempic , appetite is normal Daily BM  Lifestyle & Dietary Hx Patient lives with her caregiver.  She lives in a group home with a house mate.  Staff does shopping and cooking. Day program 5 days per week ([facility]).  She states that she has a boyfriend and sees him there.  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   24-Hr Dietary Recall First Meal: egg sandwich on [employer] bread, milk OR cereal (honey nut cheerios OR raisin bran crunch) and milk, occasional banana OR instant sweetened oatmeal, milk Snack: none Second Meal: Salami sandwich with mayo, chips, yogurt, dill pickles, carrots and lite ranch (3 T) Snack: none Third Meal: cabbage soup with italian sausage, cornbread (1) with butter Snack: none Beverages: water, decaf coffee with SF coffeemate, 2% milk, occasional diet soda  (1/week)  Estimated Energy Needs Calories: 1500 Carbohydrate: 180 g Protein: 75 g Fat: 50 g   NUTRITION DIAGNOSIS  NB-1.1 Food and nutrition-related knowledge deficit As related to balance of carbohydrates, protein, and fat.  As evidenced by diet hx and patient report.   NUTRITION INTERVENTION  Nutrition education (E-1) on the following topics:  Benefits of exercise on blood glucose Beverage choices and importance of water as her primary beverage Fat content of diet and implication of saturated fat on cholesterol and heart health Simple meal planning - half plate vegetables, lean protein, consistent portions of carbohydrate Portion size and frequency of processed foods/refined sugar  Handouts Provided Include  How to Thrive:  A Guide for Your Journey with Diabetes by the ADA My Plate  Learning Style & Readiness for Change Teaching method utilized: Visual & Auditory  Demonstrated degree of understanding via: Teach Back  Barriers to learning/adherence to lifestyle change: resistance to change  Goals Established by Pt Continue to stay active!  Continue to drink plenty of water.  Continue foods that are mostly baked, grill, or other low fat preparation. Continue 1/2 your plate vegetables.  Consider:  1/2 sandwich or open faced sandwich  Decreased portion size and frequency of cereal  Add fresh fruit with breakfast  Salad or soup more often for lunch  Less creamer in the coffee, use low fat milk for part of this.   MONITORING & EVALUATION Dietary intake, weekly physical activity in 2 months.  Next Steps  Patient is to call for questions.

## 2024-09-21 ENCOUNTER — Other Ambulatory Visit: Payer: Self-pay | Admitting: Family Medicine

## 2024-10-21 ENCOUNTER — Encounter: Attending: Family Medicine | Admitting: Dietician

## 2024-10-21 DIAGNOSIS — E119 Type 2 diabetes mellitus without complications: Secondary | ICD-10-CM | POA: Diagnosis present

## 2024-10-21 NOTE — Progress Notes (Signed)
" ° °  Medical Nutrition Therapy  Appointment Start time:  [phone]  Appointment End time:  89 Patient is here today with her provider.  She was last seen by this RD on 08/26/2024.  She continues to stay active.    Primary concerns today: A1C went up despite Ozempic  and Metformin .  They would like lifestyle changes to improve her blood glucose. Paper assessment shows that patient is in denial about her diabetes. Referral diagnosis: Type 2 Diabetes Preferred learning style: visual, hands on Learning readiness: contemplating, change in progress   NUTRITION ASSESSMENT  60 180 lbs 2/29/2026 181 lbs 08/26/2024 No recent weight change.  Clinical Medical Hx: Type 2 Diabetes (2021), seizures Medications: Metformin , estarylla, Ozempic  (2 mg) Labs: A1C:  7.9% 07/27/2024 and 7.5 % 4 months prior Notable Signs/Symptoms: isn't losing weight on ozempic , appetite is normal Daily BM  Lifestyle & Dietary Hx Patient lives with her caregiver.  She lives in a group home with a house mate and her brother.  Staff does shopping and cooking.   Day program 5 days per week ([facility]).  She states that she has a boyfriend and sees him there.  Estimated daily fluid intake: a lot Supplements: MVI, magnesium , Vitamin D , Calcium  Sleep: very good - 9 hours Stress / self-care: normal Current average weekly physical activity: swimming once per week (40 laps), boot camp once per week, tennis lesson once per week (60 minutes), walks at her day program daily. 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   24-Hr Dietary Recall First Meal: scrambled eggs, blueberries, coffee with no sugar creamer, 2% milk Snack: none Second Meal: Salami sandwich with mayo, chips, low sugar yogurt, carrots and lite ranch (3 T) Snack: none Third Meal: 2 fried eggs, sausage patties, hash browns, 2 slices [employer] toast, occasional hot chocolate Snack: none Beverages: water, decaf coffee with SF  coffeemate, 2% milk, occasional diet soda (1/week)  Estimated Energy Needs Calories: 1500 Carbohydrate: 180 g Protein: 75 g Fat: 50 g   NUTRITION DIAGNOSIS  NB-1.1 Food and nutrition-related knowledge deficit As related to balance of carbohydrates, protein, and fat.  As evidenced by diet hx and patient report.   NUTRITION INTERVENTION  Nutrition education (E-1) on the following topics: initial and continued Benefits of exercise on blood glucose Beverage choices and importance of water as her primary beverage Fat content of diet and implication of saturated fat on cholesterol and heart health Simple meal planning - half plate vegetables, lean protein, consistent portions of carbohydrate Portion size and frequency of processed foods/refined sugar  Handouts Provided Include (initial) How to Thrive:  A Guide for Your Journey with Diabetes by the ADA My Plate Label reading - today  Learning Style & Readiness for Change Teaching method utilized: Visual & Auditory  Demonstrated degree of understanding via: Teach Back  Barriers to learning/adherence to lifestyle change: resistance to change  Goals Established by Pt Keep active! Eat a healthy diet with plenty of vegetables Not too much butter or other fat. All drinks should be without sugar.  MONITORING & EVALUATION Dietary intake, weekly physical activity in 2 months.  Next Steps  Patient is to call for questions. "

## 2024-10-21 NOTE — Patient Instructions (Addendum)
 Cindy [name]

## 2024-10-28 ENCOUNTER — Other Ambulatory Visit (HOSPITAL_COMMUNITY): Admission: RE | Admit: 2024-10-28 | Source: Ambulatory Visit

## 2024-10-28 ENCOUNTER — Ambulatory Visit: Admitting: Licensed Practical Nurse

## 2024-10-28 ENCOUNTER — Encounter: Payer: Self-pay | Admitting: Licensed Practical Nurse

## 2024-10-28 VITALS — BP 132/67 | HR 97 | Ht 60.0 in | Wt 179.1 lb

## 2024-10-28 DIAGNOSIS — Z01419 Encounter for gynecological examination (general) (routine) without abnormal findings: Secondary | ICD-10-CM

## 2024-10-28 DIAGNOSIS — N921 Excessive and frequent menstruation with irregular cycle: Secondary | ICD-10-CM

## 2024-10-28 DIAGNOSIS — N898 Other specified noninflammatory disorders of vagina: Secondary | ICD-10-CM

## 2024-10-28 DIAGNOSIS — Z86018 Personal history of other benign neoplasm: Secondary | ICD-10-CM

## 2024-10-28 DIAGNOSIS — Z1231 Encounter for screening mammogram for malignant neoplasm of breast: Secondary | ICD-10-CM

## 2024-10-28 NOTE — Progress Notes (Signed)
 "   Gynecology Annual Exam  PCP: Tharon Lung, MD  Chief Complaint: No chief complaint on file.   History of Present Illness: Cindy [name] is a 49 y.o. G0P0000 presents for annual exam. Cindy Cindy [name] has some concerns, she takes OCP's continuously to not get a period but will still bleed once or twice a year and does not like that.    Here with her caregiver Latethia   Perimenopause: Sometimes night sweats   Cindy Cindy [name] had an US  years ago that shows a fibroid: Uterine enlargement with large 12.3 cm hypoechoic fundal mass most likely representing a fibroid. Nonvisualization of Cindy ovaries.  LMP: No LMP recorded. (Menstrual status: Oral contraceptives). Average Interval:Skips placebo, gets cycle every once in a while, bleeds once or twice a year  Duration of flow: 3-4 days Heavy Menses: changes heavy pad frequently  Clots: not sure  Intermenstrual Bleeding: no  Postcoital Bleeding: no Dysmenorrhea: yes sometimes, uses heating pad    Cindy Cindy [name] has never been sexually active. She currently uses OCP (estrogen/progesterone) for contraception. She NA dyspareunia.  Cindy Cindy [name] does not perform self breast exams.  There is no notable family history of breast or ovarian cancer in her family. Paternal grandma breast CA, cousin with Breast CA   Cindy Cindy [name] wears seatbelts: yes.   Cindy Cindy [name] has regular exercise: yes.  Swim, tennis, bocce ball and bowling   Cindy Cindy [name] denies current symptoms of depression.  Admits to anxiety, has a caregiver that helps calm her  Goes to a day program, is a Psychologist, sport and exercise, likes to go to [facility] in a group home. Has close friends that she considers her brothers  Has a boyfriend, feels safe PCP Dr Tharon Dentist: up to date Eye exam last May    Review of Systems: ROS see HPI   Past Medical History:  Cindy [name] Active Problem List   Diagnosis Date Noted Date Diagnosed   Screening for colon cancer 04/15/2024    Elevated blood pressure reading  07/08/2023    Breakthrough bleeding on OCPs 03/28/2019    Obesity 03/28/2017    History of abnormal mammogram 03/28/2017     Per records, 05/20/2016. R diagnostic and R breast US  benign     Osteopenia 03/28/2017     Noted in prior PCP records ( office visit 07/29/2016) but cannot find DEXA results    Intellectual disability 03/14/2017    Uterine mass 03/14/2017    History of vitamin D  deficiency 02/10/2017    Uses oral contraception 11/05/2016     Skips placebo     Type 2 diabetes mellitus without complications (HCC) 11/01/2016     A1c 10/2016: 5.8    Epilepsy (HCC) 11/01/2016     Per Cindy [name] and her caregiver, seizures started at 38 mo of age. Last seizure Oct 12, 2013.  Cyber Onyx Implant Stimulator to control seizure 03/2016 Neurologist Dr. Addie [phone]    HLD (hyperlipidemia) 11/01/2016     Past Surgical History:  Past Surgical History:  Procedure Laterality Date   cyber onyx implant  03/2016   help control sizures     Gynecologic History:  No LMP recorded. (Menstrual status: Oral contraceptives). Contraception: OCP (estrogen/progesterone) Last Pap: Results were: 2018 no abnormalities  Last mammogram:09/2023 Results were: BI-RAD I  Obstetric History: G0P0000  Family History:  Family History  Problem Relation Age of Onset   High Cholesterol Mother    High Cholesterol Father    Other Father  Pacemaker    Breast cancer Paternal Grandmother     Social History:  Social History   Socioeconomic History   Marital status: Single    Spouse name: Not on file   Number of children: 0   Years of education: Not on file   Highest education level: Not on file  Occupational History   Occupation: disable  Tobacco Use   Smoking status: Never   Smokeless tobacco: Never  Vaping Use   Vaping status: Never Used  Substance and Sexual Activity   Alcohol use: No   Drug use: No   Sexual activity: Never    Birth control/protection: Pill  Other Topics Concern    Not on file  Social History Narrative   [facility] 19[facility]41[facility]    Never smoker, no alcohol or other drugs   Never been sexually active   Social Drivers of Health   Tobacco Use: Low Risk (10/28/2024)   Cindy [name] History    Smoking Tobacco Use: Never    Smokeless Tobacco Use: Never    Passive Exposure: Not on file  Financial Resource Strain: Low Risk (12/01/2023)   Overall Financial Resource Strain (CARDIA)    Difficulty of Paying Living Expenses: Not hard at all  Food Insecurity: No Food Insecurity (12/01/2023)   Hunger Vital Sign    Worried About Running Out of Food in [place] Last Year: Never true    Ran Out of Food in [place] Last Year: Never true  Transportation Needs: No Transportation Needs (12/01/2023)   PRAPARE - Administrator, Civil Service (Medical): No    Lack of Transportation (Non-Medical): No  Physical Activity: Sufficiently Active (12/01/2023)   Exercise Vital Sign    Days of Exercise per Week: 3 days    Minutes of Exercise per Session: 60 min  Stress: No Stress Concern Present (12/01/2023)   [employer] of Occupational Health - Occupational Stress Questionnaire    Feeling of Stress : Only a little  Social Connections: Socially Isolated (12/01/2023)   Social Connection and Isolation Panel    Frequency of Communication with Friends and Family: More than three times a week    Frequency of Social Gatherings with Friends and Family: More than three times a week    Attends Religious Services: Never    Database Administrator or Organizations: No    Attends Banker Meetings: Never    Marital Status: Never married  Intimate Partner Violence: Unknown (12/01/2023)   Humiliation, Afraid, Rape, and Kick questionnaire    Fear of Current or Ex-Partner: No    Emotionally Abused: No    Physically Abused: No    Sexually Abused: Not on file  Depression (PHQ2-9): Low Risk (08/26/2024)   Depression (PHQ2-9)    PHQ-2 Score: 0  Alcohol Screen: Low  Risk (12/01/2023)   Alcohol Screen    Last Alcohol Screening Score (AUDIT): 0  Housing: Low Risk (12/01/2023)   Housing Stability Vital Sign    Unable to Pay for Housing in [place] Last Year: No    Number of Times Moved in [place] Last Year: 0    Homeless in [place] Last Year: No  Utilities: Not At Risk (12/01/2023)   [facility] Utilities    Threatened with loss of utilities: No  Health Literacy: Adequate Health Literacy (12/01/2023)   B1300 Health Literacy    Frequency of need for help with medical instructions: Never    Allergies:  Allergies[1]  Medications: Prior to Admission medications  Medication Sig Start Date End Date Taking? Authorizing Provider  cetirizine  (ZYRTEC ) 10 MG tablet TAKE (1) TABLET BY MOUTH ONCE DAILY. 11/22/22  Yes Pray, Rollene BRAVO, MD  cholecalciferol 25 MCG (1000 UT) tablet Take 2 tablets (2,000 Units total) by mouth daily. 11/22/22  Yes Pray, Rollene BRAVO, MD  escitalopram (LEXAPRO) 20 MG tablet Take 30 mg by mouth daily.   Yes [provider]  levETIRAcetam (KEPPRA) 500 MG tablet Take 1,250 mg by mouth 2 (two) times daily.   Yes [provider]  Magnesium  Oxide -Mg Supplement 250 MG TABS Take 1 tablet (250 mg total) by mouth daily. 11/22/22  Yes Pray, Rollene BRAVO, MD  metFORMIN  (GLUCOPHAGE -XR) 500 MG 24 hr tablet TAKE 2 TABLETS(1000 MG) BY MOUTH TWICE DAILY WITH A MEAL 08/16/24  Yes Mabe, Elna, MD  Multiple Vitamins-Minerals (SENTRY) TABS Take 1 tablet by mouth daily. 09/11/22  Yes Lilland, Alana, DO  norgestimate -ethinyl estradiol  (ESTARYLLA) 0.25-35 MG-MCG tablet Take 1 tablet by mouth daily. Skip placebo pills. 04/19/24  Yes Mabe, Elna, MD  Allie Gutta Calcium  500 MG TABS Take 0.4 tablets (500 mg total) by mouth 2 (two) times daily with a meal. 11/22/22  Yes Pray, Rollene BRAVO, MD  Semaglutide , 2 MG/DOSE, (OZEMPIC , 2 MG/DOSE,) 8 MG/3ML SOPN INJECT 2 MG UNDER Cindy SKIN ONCE A WEEK 09/21/24  Yes Mabe, Elna, MD  simvastatin  (ZOCOR ) 40 MG tablet TAKE 1 TABLET BY MOUTH  EVERY NIGHT AT BEDTIME 12/22/23  Yes Mabe, Elna, MD  valsartan  (DIOVAN ) 80 MG tablet Take 0.5 tablets (40 mg total) by mouth daily. 07/27/24  Yes Tharon Elna, MD    Physical Exam Vitals: Blood pressure 132/67, pulse 97, height 5' (1.524 m), weight 179 lb 1.6 oz (81.2 kg).  General: NAD HEENT: normocephalic, anicteric Thyroid: no enlargement, no palpable nodules Pulmonary: No increased work of breathing, CTAB Cardiovascular: RRR, distal pulses 2+ Breast: declined exam  Abdomen: NABS, soft, non-tender, non-distended.  Umbilicus without lesions.  No hepatomegaly, splenomegaly or masses palpable. No evidence of hernia  Genitourinary:  External: Normal external female genitalia. Skin irritated looked, difficult to visualize vestibule d/t Cindy Cindy [name]'s discomfort.               Skin along left groin discolored with a dry appearance, my be fungal, right side with similar appearance.   Vagina:   Cervix:   Uterus:   Adnexa:   Rectal: deferred  Lymphatic: no evidence of inguinal lymphadenopathy Extremities: no edema, erythema, or tenderness Neurologic: Grossly intact Psychiatric: mood appropriate, affect full  Unable to perform speculum exam as Cindy Cindy [name] exhibited great discomfort once speculum was partially inserted to introitus, aske Cindy Cindy [name] if she woul like to stop, Cindy Cindy [name] said  yes, speculum removed and exam ended. Bimanual exam not attempted.   Female chaperone present for pelvic and breast  portions of Cindy physical exam   Assessment: 49 y.o. G0P0000 routine annual exam  Plan: Problem List Items Addressed This Visit   None Visit Diagnoses       Encounter for screening mammogram for malignant neoplasm of breast    -  Primary   Relevant Orders   MM DIGITAL SCREENING BILATERAL     Menorrhagia with irregular cycle       Relevant Orders   US  PELVIS TRANSVAGINAL NON-OB (TV ONLY)   US  PELVIS (TRANSABDOMINAL ONLY)     History of uterine fibroid       Relevant Orders   US  PELVIS TRANSVAGINAL NON-OB (TV  ONLY)   US  PELVIS (TRANSABDOMINAL ONLY)  Vaginal irritation       Relevant Orders   Cervicovaginal ancillary only       1) Mammogram - recommend yearly screening mammogram.  Mammogram Was ordered today   2) STI screening  wasoffered and declined  3) ASCCP guidelines and rational discussed.  Discussed returning at a later date to collect pap, may consider a 1 time dose of Ativan.   4) Contraception - Cindy Cindy [name] is currently using  OCP (estrogen/progesterone).  She is not sexually active, using OCP for cycle control -discussed we could try LARC with Cindy hopes of completely stopping her cycle, but not everyone has that response so I would hate to try something that may not work as well as what you are doing. Cindy Cindy [name] seems satisfied with taking a pill daily, just does not like to get a period. Does not want an IUD.  -Discussed she could be in perimenopause  5) Colonoscopy  had in 2025-- Screening recommended starting at age 28 for average risk individuals, age 79 for individuals deemed at increased risk (including African Americans) and recommended to continue until age 2.  For Cindy [name] age 84-85 individualized approach is recommended.  Gold standard screening is via colonoscopy, Cologuard screening is an acceptable alternative for Cindy [name] unwilling or unable to undergo colonoscopy.  Colorectal cancer screening for average?risk adults: 2018 guideline update from [place] American Cancer SocietyCA: A Cancer Journal for Clinicians: Feb 19, 2017   6) Routine healthcare maintenance including cholesterol, diabetes screening discussed managed by PCP -Cindy Cindy [name] will speak with her PCP about Ativan for Pelvic US  and pap smear  7) Pelvic US  ordered to check on fibroid, will refer based on results.   8) discussed her vaginal and groin tissue could be irritated from wearing a bathing suit for extended periods. Recommended wearing suit only when swimming, not hours before, and to remove suit as soon as she is done  swimming. Disturbance in tissue could be fungla or related to hygiene as well. May try OTC antifungal cream, please see derm if it does not resolve.    Jinnie Cookey, CNM  [facility] 10/28/2024, 12:52 PM           [1] No Known Allergies  "

## 2024-10-29 LAB — CERVICOVAGINAL ANCILLARY ONLY
Bacterial Vaginitis (gardnerella): NEGATIVE
Candida Glabrata: NEGATIVE
Candida Vaginitis: NEGATIVE
Comment: NEGATIVE
Comment: NEGATIVE
Comment: NEGATIVE

## 2024-11-01 ENCOUNTER — Ambulatory Visit: Payer: Self-pay | Admitting: Student

## 2024-11-10 ENCOUNTER — Other Ambulatory Visit

## 2024-12-02 ENCOUNTER — Encounter

## 2025-01-20 ENCOUNTER — Encounter: Admitting: Dietician
